# Patient Record
Sex: Female | Born: 1990 | Race: White | Hispanic: No | Smoking: Former smoker
Health system: Southern US, Community
[De-identification: ages and names within clinical notes are randomized; demographics above are authoritative.]

## PROBLEM LIST (undated history)

## (undated) DIAGNOSIS — E049 Nontoxic goiter, unspecified: Secondary | ICD-10-CM

## (undated) DIAGNOSIS — R011 Cardiac murmur, unspecified: Secondary | ICD-10-CM

## (undated) DIAGNOSIS — F419 Anxiety disorder, unspecified: Secondary | ICD-10-CM

## (undated) DIAGNOSIS — L0292 Furuncle, unspecified: Secondary | ICD-10-CM

## (undated) DIAGNOSIS — M419 Scoliosis, unspecified: Secondary | ICD-10-CM

## (undated) DIAGNOSIS — Z22322 Carrier or suspected carrier of Methicillin resistant Staphylococcus aureus: Secondary | ICD-10-CM

## (undated) DIAGNOSIS — F988 Other specified behavioral and emotional disorders with onset usually occurring in childhood and adolescence: Secondary | ICD-10-CM

## (undated) HISTORY — DX: Anxiety disorder, unspecified: F41.9

## (undated) HISTORY — PX: BREAST SURGERY: SHX581

## (undated) HISTORY — PX: TUBAL LIGATION: SHX77

## (undated) HISTORY — DX: Other specified behavioral and emotional disorders with onset usually occurring in childhood and adolescence: F98.8

## (undated) HISTORY — DX: Scoliosis, unspecified: M41.9

## (undated) HISTORY — PX: COSMETIC SURGERY: SHX468

---
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
## 2010-01-28 ENCOUNTER — Ambulatory Visit (HOSPITAL_COMMUNITY): Admission: RE | Admit: 2010-01-28 | Discharge: 2010-01-28 | Payer: Self-pay | Admitting: Obstetrics and Gynecology

## 2010-07-09 ENCOUNTER — Inpatient Hospital Stay (HOSPITAL_COMMUNITY): Admission: AD | Admit: 2010-07-09 | Discharge: 2010-07-12 | Payer: Self-pay | Admitting: Obstetrics and Gynecology

## 2010-10-05 ENCOUNTER — Emergency Department (HOSPITAL_COMMUNITY)
Admission: RE | Admit: 2010-10-05 | Discharge: 2010-10-05 | Payer: Self-pay | Source: Home / Self Care | Admitting: General Practice

## 2010-11-22 ENCOUNTER — Encounter: Payer: Self-pay | Admitting: Obstetrics and Gynecology

## 2011-01-12 LAB — URINALYSIS, ROUTINE W REFLEX MICROSCOPIC
Bilirubin Urine: NEGATIVE
Glucose, UA: NEGATIVE mg/dL
Hgb urine dipstick: NEGATIVE
Ketones, ur: 15 mg/dL — AB
Nitrite: NEGATIVE
Protein, ur: NEGATIVE mg/dL
Specific Gravity, Urine: 1.014 (ref 1.005–1.030)
Urobilinogen, UA: 0.2 mg/dL (ref 0.0–1.0)
pH: 5.5 (ref 5.0–8.0)

## 2011-01-12 LAB — CBC
MCH: 28.8 pg (ref 26.0–34.0)
Platelets: 278 10*3/uL (ref 150–400)
RBC: 4.41 MIL/uL (ref 3.87–5.11)
RDW: 13.1 % (ref 11.5–15.5)
WBC: 6.4 10*3/uL (ref 4.0–10.5)

## 2011-01-12 LAB — DIFFERENTIAL
Basophils Relative: 1 % (ref 0–1)
Eosinophils Relative: 2 % (ref 0–5)
Lymphs Abs: 2.4 10*3/uL (ref 0.7–4.0)
Neutrophils Relative %: 52 % (ref 43–77)

## 2011-01-14 LAB — CBC
HCT: 31 % — ABNORMAL LOW (ref 36.0–46.0)
Hemoglobin: 10.8 g/dL — ABNORMAL LOW (ref 12.0–15.0)
Hemoglobin: 12.2 g/dL (ref 12.0–15.0)
MCH: 33 pg (ref 26.0–34.0)
MCH: 33.5 pg (ref 26.0–34.0)
MCHC: 34.8 g/dL (ref 30.0–36.0)
MCV: 95.3 fL (ref 78.0–100.0)
RBC: 3.69 MIL/uL — ABNORMAL LOW (ref 3.87–5.11)

## 2011-03-16 NOTE — H&P (Signed)
Stacy Vaughan, Stacy Vaughan            ACCOUNT NO.:  [id number]   MEDICAL RECORD NO.:  [id number]          PATIENT TYPE:  INP   LOCATION:  0104                          FACILITY:  [facility]   PHYSICIAN:  Lalla Brothers, MDDATE OF BIRTH:  1991-04-24   DATE OF ADMISSION:  10/30/2007  DATE OF DISCHARGE:                       PSYCHIATRIC ADMISSION ASSESSMENT   IDENTIFICATION:  A 7-1/20-year-old female, eleventh grade student at  [name], is admitted emergently voluntarily on  referral from [facility] for inpatient stabilization and  treatment of suicide risk and depression.  The patient was scheduled for  her fourth psychotherapy session with Dr. Dorothyann Gibbs for 5:00 p.m. the  day of admission but had been unable to cognitively process and  effectively participate in therapy for beneficial change thus far.  In  fact, the patient has become more depressed such that for the 2 days  preceding admission, she had been threatening to shoot herself to commit  suicide.  She was attempting to elope from home and reported she could  not remember what was going on such that she could not contract for  safety.  The patient has no hope or interest in improvement but rather  predicts that things will only get worse and worse.  She is apathetic  and devaluing of the concerns and caring of others, such that she levels  her parents' and therapist's concerns, stating her old friends and those  concerned about her are babies, preferring to associate only with  boyfriend and his peer group who believe in crime and drugs but not  other aspects of life.   HISTORY OF PRESENT ILLNESS:  The patient has been accumulating mounting  consequences at home for her defiant and risk-taking behavior.  She is  now significantly confined relative to transportation and media, so that  she is just staying home.  However, the patient expects others to think  as she does relative to the lack of  meaning and purpose in life.  She  has had 3 F's on her interim report card when she had a 3.3 last grading  period and a 4.0 last year.  She takes AP and honors' courses.  She does  not take any medication for her ADHD with [facility] concluding ADHD to be present.  Pharmacotherapy was warranted and she was treated with Ritalin for 1  week prior to her testing and then Adderall for several months  afterward.  She and mother report that the Adderall caused weight loss,  dysphoric mood, and no other specific benefits.  Treatment overall  becomes devalued by the patient's process with family.  The patient is  known to have some pulmonic stenosis and mitral valve prolapse, assessed  and followed by Dr. Candis Musa and staff in the past as well as [facility], previously Dr. Brooke Dare and currently Dr. Maple Hudson.  The patient  does not acknowledge fear or have any activity limitations based in her  cardiac murmurs.  However, she is not attending to her daily health.  Her potassium is low on admission at 3.2 with lower limit  of normal 3.5,  but she will not acknowledge restricting, diuretics, or purging.  Patient is irritable and hypersensitive to the comments or actions of  others.  She is totally negative and has difficulty with impulse  control.  She is hopeless with loss of interest but does not have guilty  ruminations or self-blame.  Still, mother reports that the patient's  self-esteem has always been low whether associated with her ADHD or  social consequences.  The patient has had no known organic central  nervous system trauma unless associated with substance abuse.  She is  known to use alcohol and cannabis possibly every 2 weeks.  She will not  open up and discuss her pattern of use more thoroughly.  Parents note  that the patient arrived home late from being out with friends  approximately 0300 on October 29, 2007.  Parents note the  patient slept  for 9 hours and then was taken Urgent Care were urine drug screen was  performed and the results are pending, apparently due today.  Parents  are concerned that the patient's boyfriend and his peer group use drugs  continuously.  The patient reports that memory is limited and she has  relative thought blocking and impoverished emotions in thinking.  The  patient does not acknowledge hallucinations, but she has almost a  pseudodemented quality to her mood and cognition.   PAST MEDICAL HISTORY:  The patient is under the primary care currently  of Dr. Williemae Area.  She has a history of pulmonic stenosis and mitral  valve prolapse.  She has scars on the posterior thighs bilaterally and  the left anterior thigh.  She has contact lenses.  She denies sexual  activity.  She had chicken pox at age 50.  She has a potassium of 3.2 on  her initial laboratory findings but does not acknowledge mechanism for  depletion.  She had no medication allergies.  She has no history of  seizure or syncope.  She has had no arrhythmia.  She does not  acknowledge purposefully restricting in her diet or purging.  She denies  diuretics or other medications currently.   REVIEW OF SYSTEMS:  The patient denies difficulty with gait, gaze or  continence.  She denies exposure to communicable disease or toxins.  She  denies rash, jaundice or purpura.  She reports that memory is difficult  but has no headache.  She has no sensory loss or coordination deficit.  There is no cough, congestion, dyspnea, tachypnea or wheeze.  There is  no chest pain, palpitations or presyncope currently.  There is no  abdominal pain, nausea, vomiting or diarrhea acknowledged.  There is no  dysuria or arthralgia.   IMMUNIZATIONS:  Up-to-date.   FAMILY HISTORY:  The patient is an only child residing with both  parents.  She considers father to have been easier to talk to in the  past and mother more difficult.  However, she is now  having even more  difficulty with father in her opinion.  She informs parents that she  hates them and that things will never change for the better.  Paternal  aunt and maternal grandmother, as well as paternal great-grandmother,  have had depression.  There is a family history of hypertension and  diabetes mellitus on the extended maternal side.  She considers parents,  as well as past friends, to be babies and immature as they do not agree  with her opinions on validity of crime and drugs.  Maternal great-  grandfather died in 04-23-2007.   SOCIAL AND DEVELOPMENTAL HISTORY:  The patient is an eleventh grade  student at [name].  They report that her last  grading period secured a 3.3 GPA, having 2 AP courses and 2 honors'  courses that continue.  She now has 3 F's on the interim report.  She  had a four-point GPA last school year and parents report that she is  eligible for a 5.0 if she takes the 2 AP and 2 honors' classes.  The  patient denies sexual activity.  She acknowledges alcohol and cannabis  every 2 weeks.  The parents are concerned that there is much more  prevalent use, even to the point of impairing memory, concentration and  academic performance, as well as judgment and decisions.  The patient  denies legal charges.   ASSETS:  The patient is highly intelligent.   MENTAL STATUS EXAM:  VITAL SIGNS:  Height is 172 cm and weight is 64.5  kg.  Blood pressure is 135/72 with heart rate of 83 sitting and 123/76  with heart rate of 94 standing.  NEUROLOGIC:  She is right-handed.  She is alert and oriented though  significantly psychomotor slowed and cognitively distant.  Cranial  nerves II-XII are intact.  Muscle strength and tone are normal.  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  She may have some drug-  induced memory impairment.  There are no abnormal involuntary  movements.  Gait and gaze are intact.  MENTAL STATUS:  The patient inappropriately laughs when her low  potassium is explained and she will not clarify.  Otherwise, she is  severely dysphoric with repressed and suppressed anger.  She has  atypical depressive features with hysteroid dysphoria, hypersensitivity  to the comments or actions of others, impulse control difficulties and  easy outbursts of anger.  The patient does not present definite  hallucinations, paranoia or delusions.  She is cognitively impoverished,  whether depressive pseudodementia or marijuana memory impairment or  regressive oppositional defiance, or some combination of the 3.  She has  mild-to-moderate inattention, hyperactivity and impulsivity.  She has  suicidal ideation and planned to shoot herself to death, refusing to  contract for safety even when she could have done so in the service of  escaping of the devalued treatment.   IMPRESSION:  AXIS I:  1. Major depression, single episode, severe with atypical features.  2. Attention deficit hyperactivity disorder, combined subtype, mild-to-      moderate severity.  3. Oppositional defiant disorder (provisional diagnosis).  4. Rule out psychoactive substance abuse not otherwise specified      (provisional diagnosis).  5. Rule out eating disorder not otherwise specified (provisional      diagnosis).  6. Parent-child problem.  7. Other interpersonal problem.  8. Other specified family circumstances.  9. Noncompliance with treatment.  AXIS II:  Diagnosis deferred.  AXIS III:  1. Hypokalemia, etiology uncertain.  2. Mitral valve prolapse by history.  3. Pulmonic stenosis by history.  4. Weight loss on Adderall in the past.  5. Contact lenses.  AXIS IV:  Stressors family moderate acute and chronic; peer relations  severe acute and chronic; phase of life extreme acute and chronic;  school moderate acute and chronic.  AXIS V:  GAF on admission is 34 with highest  in last year 72.   PLAN:  The patient  is admitted for inpatient adolescent psychiatric and  multidisciplinary multimodal behavioral treatment in a team-based  programmatic locked psychiatric unit.  We will consider Wellbutrin  pharmacotherapy, and the family and patient are educated on such,  limiting the extensive discussion to that the patient's cognitive status  can tolerate.  Mother seems to expect the patient to refuse the  medication.  We will check EKG, particularly considering the low  potassium, the history of cardiac murmurs, particularly considering  possible institution of antidepressant pharmacotherapy.  We will recheck  basic metabolic panel and magnesium.  Cognitive behavioral therapy,  anger management, nutrition, substance abuse intervention, interpersonal  therapy, family therapy, social and communication skill training,  problem-solving and coping skill training, identity consolidation and  habit reversal can be undertaken.  Estimated length stay is 5-7 days  with target symptom for discharge being stabilization of suicide risk  and mood, stabilization of dangerous disruptive behavior and  generalization of the capacity for safe effective participation in  outpatient treatment.      Lalla Brothers, MD  Electronically Signed     GEJ/MEDQ  D:  10/31/2007  T:  10/31/2007  Job:  [id number]

## 2011-03-16 NOTE — Discharge Summary (Signed)
Stacy Vaughan, Stacy Vaughan            ACCOUNT NO.:  [id number]   MEDICAL RECORD NO.:  [id number]          PATIENT TYPE:  INP   LOCATION:  0104                          FACILITY:  [facility]   PHYSICIAN:  Elaina Pattee, MD       DATE OF BIRTH:  [birth year]   DATE OF ADMISSION:  10/30/2007  DATE OF DISCHARGE:  11/05/2007                               DISCHARGE SUMMARY   HISTORY OF PRESENT ILLNESS:  The patient is a 20 year old female who was  admitted to [facility] on referral from  [facility] for inpatient stabilization and treatment of  suicide risk and depression.  The patient had reportedly become  depressed and stated that she was going to shoot herself to commit  suicide.  She had been using marijuana and alcohol.  The patient has  also been sneaking out of the house at night and had been caught from  that.  She had been having her grades at school slip, and parent's were  concerned.  For full and complete history, please see admission  assessment dictated by Dr. Beverly Milch on October 30, 2007.   HOSPITAL COURSE:  The patient was admitted to the Child Unit.  She was  signed in voluntarily by her parents.  She was on no home medication at  the time of admission.  A substance abuse assessment was ordered and  completed.  The patient was initially started on Wellbutrin XL 150 mg  daily.  Initially, she refused this medication; however, after a few  days, she did become compliant.  Further lab work was ordered which  included positive marijuana screen.  A beta HCG was negative as was GC  and Chlamydia.  Urine drug screen was positive for marijuana.  TSH was  within normal limits at 2.21.  Free T4 was normal at 0.89.  RPR was  nonreactive.  GTT was 14.  Hepatic function panel was within normal  limits.  An initial BMP had a low sodium of 3.2.  CBC with differential  was within normal limits.  Because of the low potassium, an EKG was  ordered as was a  magnesium and repeat BMP.  The EKG was within normal  limits.  The repeat BMP showed a normal potassium of 3.9 and a magnesium  of 2.2.  Urine clean catch initially performed at [facility] had  greater than 100,000 colonies of lactobacillus.  Basic UA was yellow and  cloudy with moderate leukocytes and also many squamous epithelial cells  and many bacteria.  Repeat clean catch once again was positive for  lactobacillus.   While on the unit, the patient had multiple family sessions with Acquanetta Sit, M.S. to deal with family conflict.  Three sessions were held  which did resolve some of the conflict.  A nutrition consult was also  ordered which was completed and discussed at length with the patient.   The patient tolerated the dosage of Wellbutrin XL once she became  compliant with it.  It was slowly increased to a maximum dose of 300 mg  of the Wellbutrin  XL which she continued to tolerate.   On November 05, 2007, the treatment team met and felt that the patient was  appropriate for discharge.  She denied any suicidal or homicidal  thoughts, any auditory or visual hallucinations.  Insight and judgment  were both deemed to fair at that time.   DISCHARGE DIAGNOSIS:   AXIS I:  Major depressive disorder, single episode, severe.  History of  attention deficit hyperactivity disorder, oppositional defiant disorder,  marijuana abuse, and alcohol abuse.   AXIS II:  Deferred.   AXIS III:  Initial hypokalemia, now resolved.  Mitral valve prolapse by  history.  Pulmonary stenosis by history.   AXIS IV:  Family discord.   AXIS V:  GAF score on discharge is a 55.   FOLLOW UP:  Followup at this time is scheduled with Zara Chess with  [facility] on Friday, November 10, 2007.  Mother is advised  to contact case management tomorrow during working hours to arrange  psychiatric followup.   DISCHARGE MEDICATIONS:  Wellbutrin XL 300 mg daily.   MENTAL STATUS EXAM AT TIME OF  DISCHARGE:  The patient is calm and  cooperative.  She is alert and oriented x4.  Speech is regular rate and  rhythm and volume.  Mood is euthymic with somewhat reserved affect.  The  patient denies any current suicidal or homicidal ideation, any auditory  or visual hallucinations.  Insight and judgment are both deemed to be  fair.      Elaina Pattee, MD  Electronically Signed     MPM/MEDQ  D:  11/05/2007  T:  11/05/2007  Job:  [id number]

## 2011-08-06 LAB — DIFFERENTIAL
Basophils Absolute: 0
Basophils Relative: 0
Monocytes Relative: 5
Neutro Abs: 4.4
Neutrophils Relative %: 62

## 2011-08-06 LAB — BASIC METABOLIC PANEL
BUN: 8
CO2: 28
Calcium: 9
Calcium: 9.2
Creatinine, Ser: 0.85
Creatinine, Ser: 0.95
Sodium: 141

## 2011-08-06 LAB — HEPATIC FUNCTION PANEL
Alkaline Phosphatase: 65
Bilirubin, Direct: 0.1
Indirect Bilirubin: 0.5
Total Bilirubin: 0.6

## 2011-08-06 LAB — URINALYSIS, ROUTINE W REFLEX MICROSCOPIC
Bilirubin Urine: NEGATIVE
Glucose, UA: NEGATIVE
Ketones, ur: NEGATIVE
pH: 6.5

## 2011-08-06 LAB — T4, FREE: Free T4: 0.89

## 2011-08-06 LAB — DRUGS OF ABUSE SCREEN W/O ALC, ROUTINE URINE
Benzodiazepines.: NEGATIVE
Creatinine,U: 259.2
Marijuana Metabolite: POSITIVE — AB
Opiate Screen, Urine: NEGATIVE
Propoxyphene: NEGATIVE

## 2011-08-06 LAB — CBC
MCHC: 34.4
Platelets: 231
RBC: 4.31
RDW: 13.2

## 2011-08-06 LAB — GAMMA GT: GGT: 14

## 2011-08-06 LAB — URINE CULTURE

## 2011-08-06 LAB — URINE MICROSCOPIC-ADD ON

## 2011-08-06 LAB — RPR: RPR Ser Ql: NONREACTIVE

## 2011-10-15 IMAGING — US US OB DETAIL+14 WK
1 series · 14 of 28 positions shown · non-contrast
Comparison: none

OBSTETRICAL ULTRASOUND:
 This ultrasound was performed in The [HOSPITAL], and the [facility] report will be stored to [REDACTED] PACS.  This report is also available in [HOSPITAL]?s accessANYware.

[Series 1: us ob detail+14 wk · 14 of 115 slices shown]
[im 5/115]
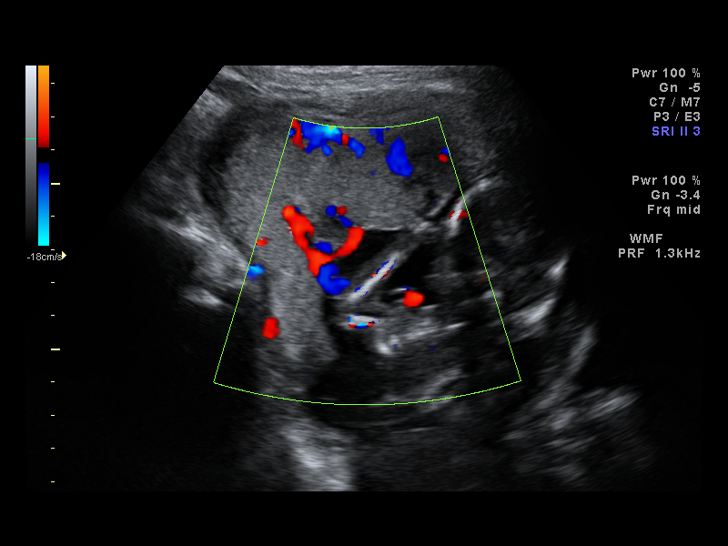
[im 13/115]
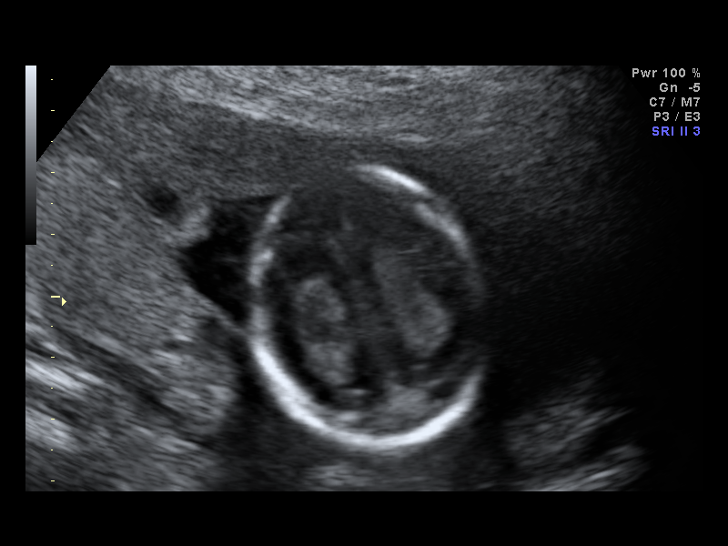
[im 22/115]
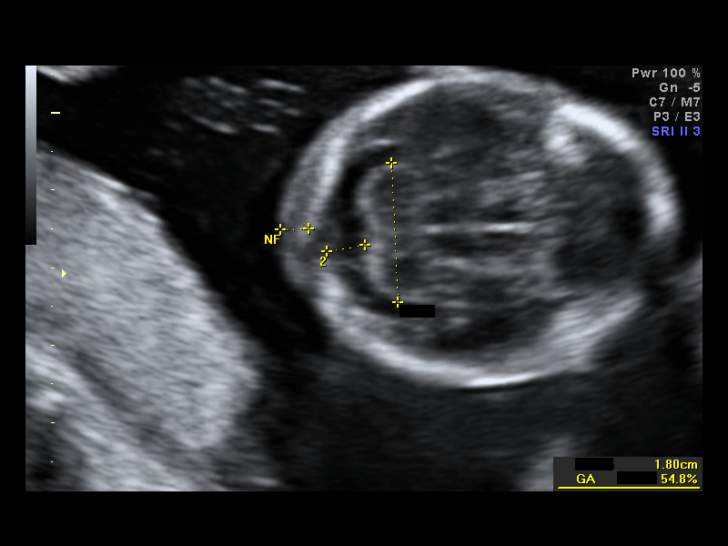
[im 30/115]
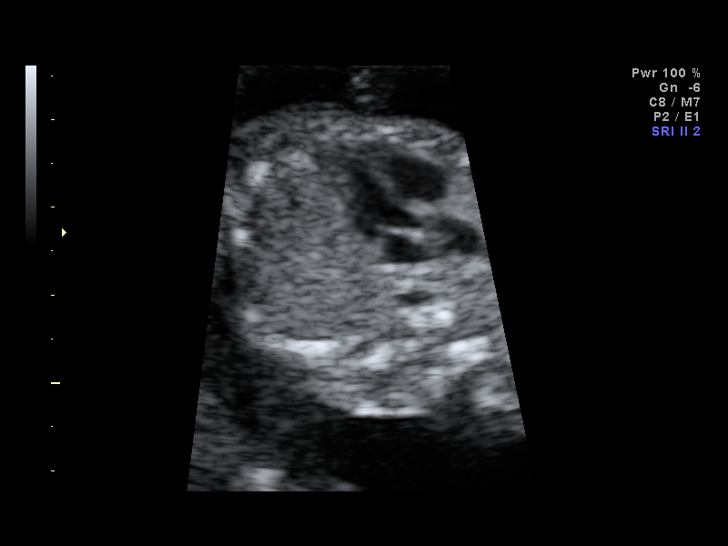
[im 39/115]
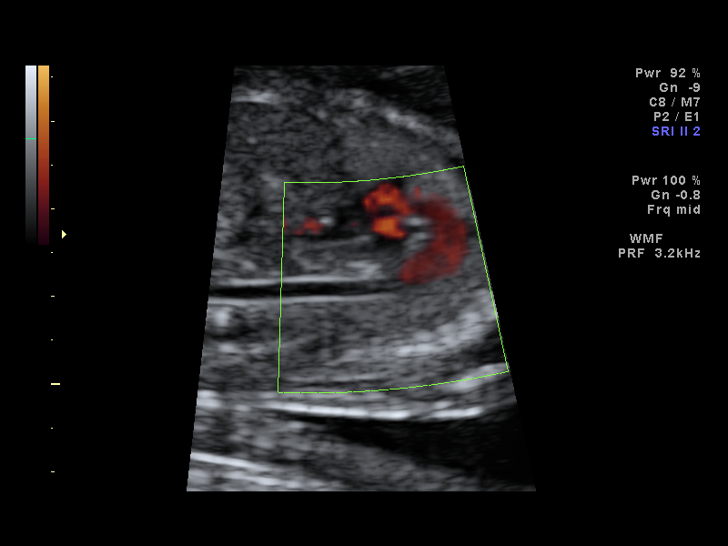
[im 47/115]
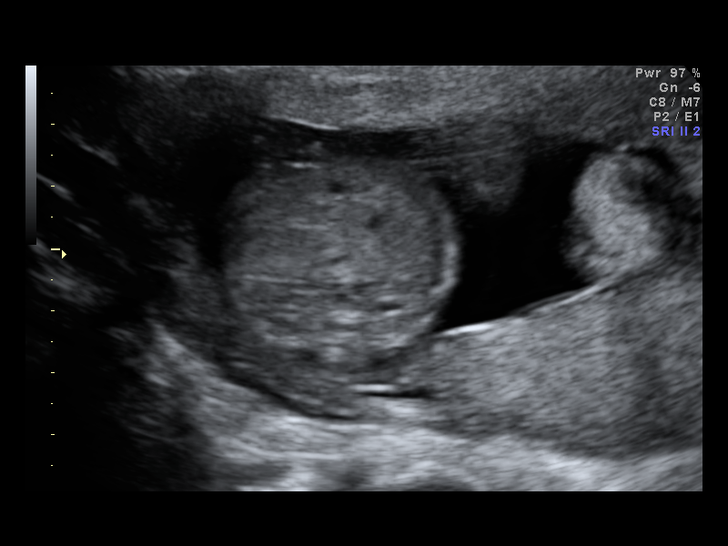
[im 55/115]
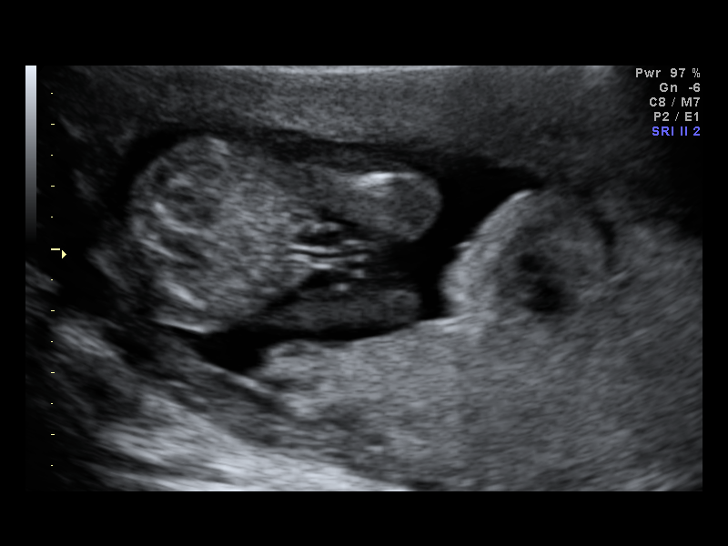
[im 64/115]
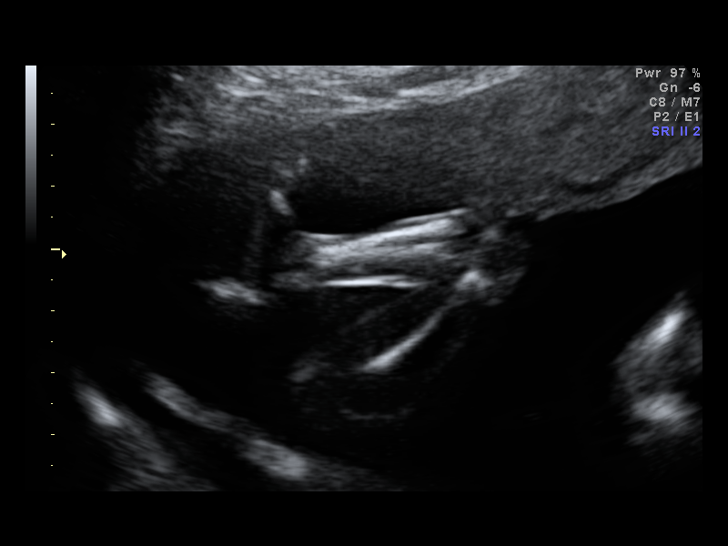
[im 72/115]
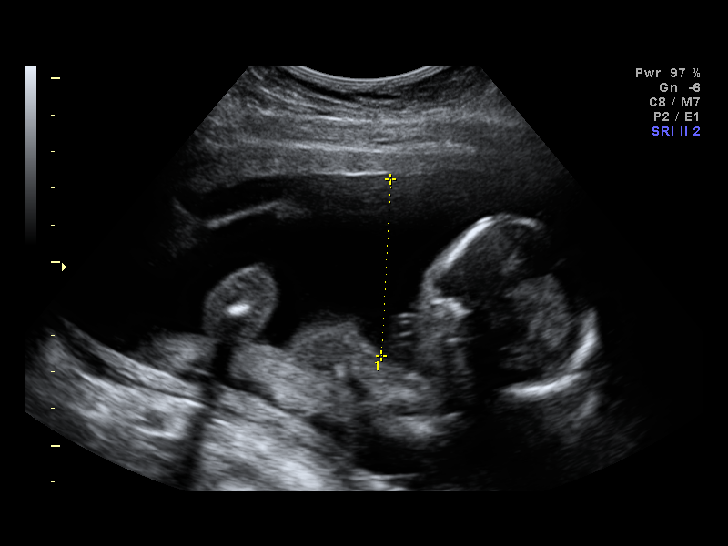
[im 81/115]
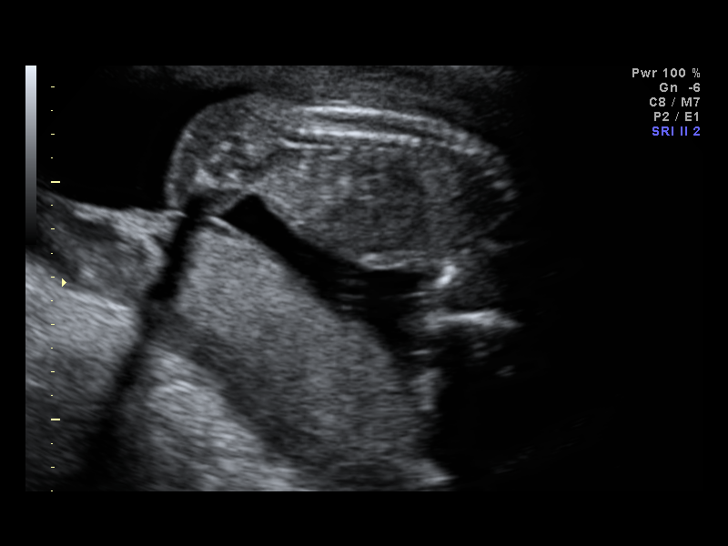
[im 89/115]
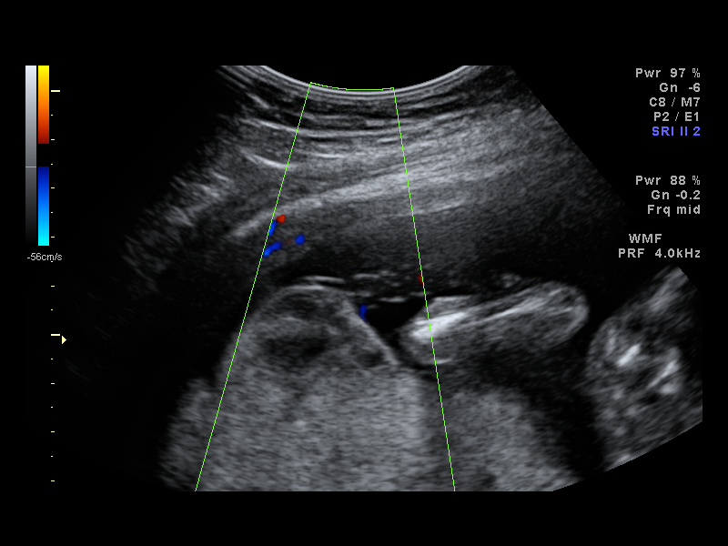
[im 98/115]
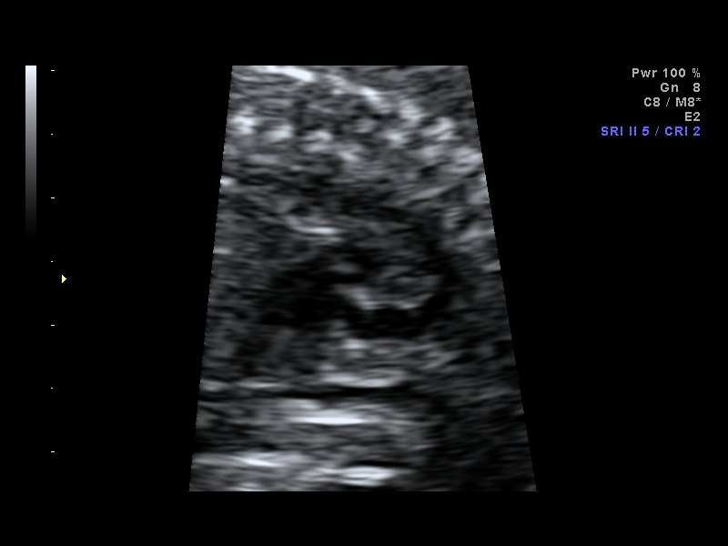
[im 106/115]
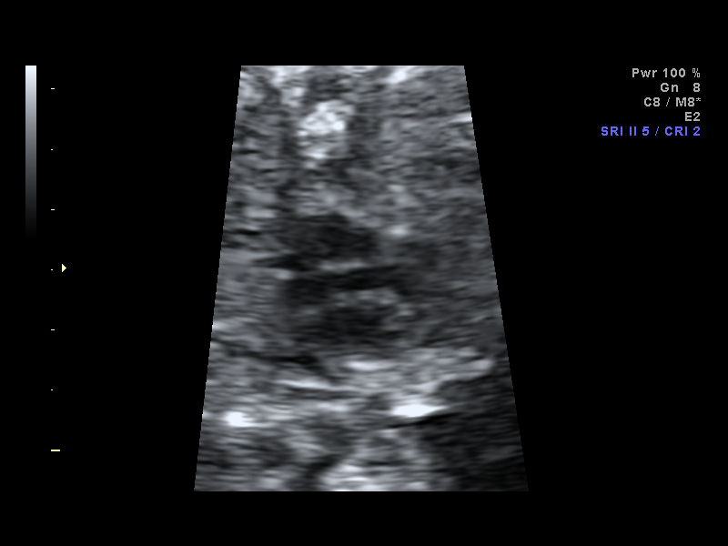
[im 115/115]
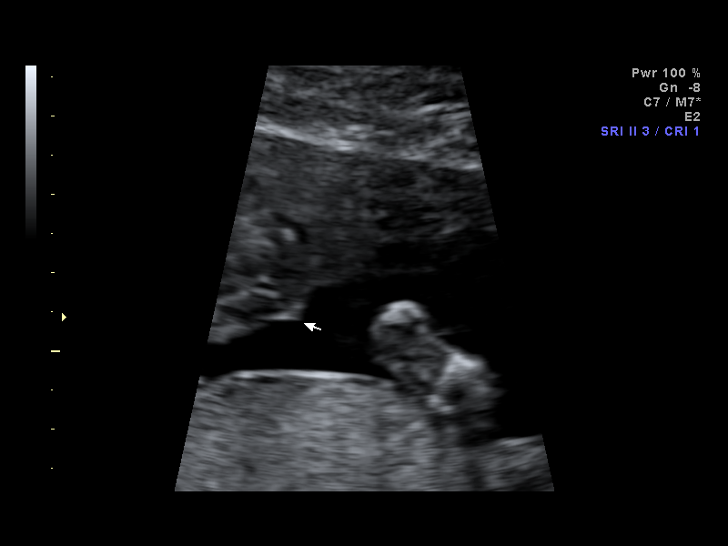

[14 of 28 positions shown; findings below may reference images not displayed]

IMPRESSION: [facility] has also been faxed to the ordering physician.

## 2012-06-21 IMAGING — US US ABDOMEN COMPLETE
1 series · 14 of 25 positions shown · non-contrast
Comparison: None

CLINICAL DATA: Elevated liver function tests.  Abdominal pain.

COMPLETE ABDOMINAL ULTRASOUND

[Series 1: us abdomen complete · 0.30mm/px · 14 of 77 slices shown]
[im 1/77]
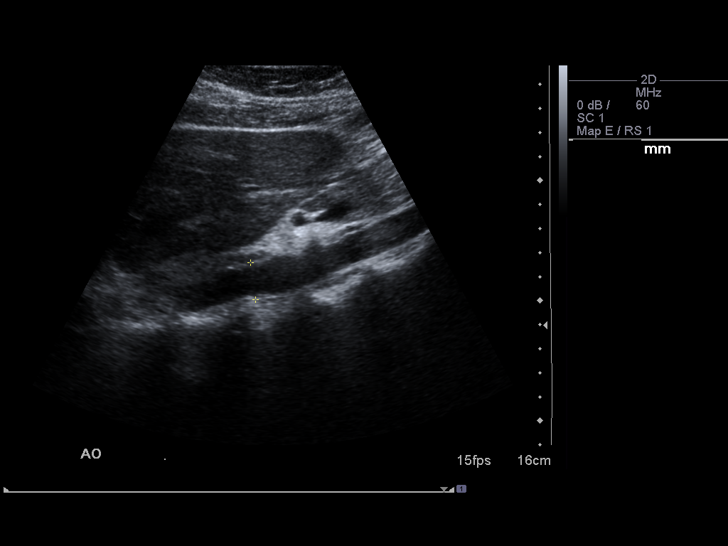
[im 7/77]
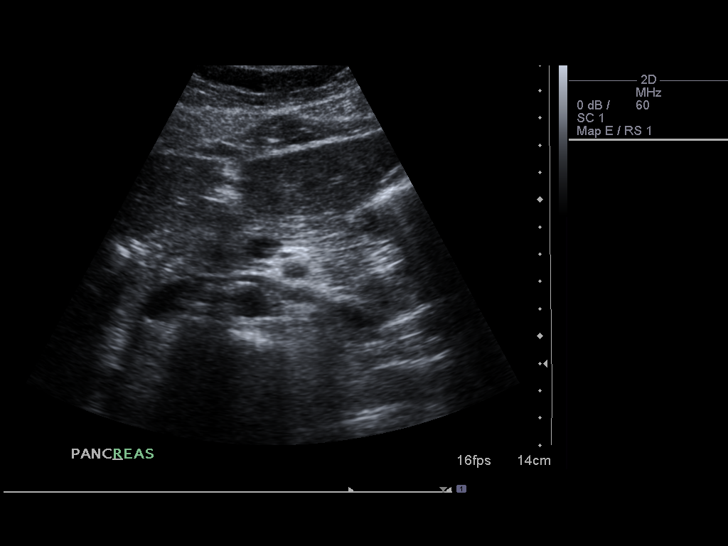
[im 13/77]
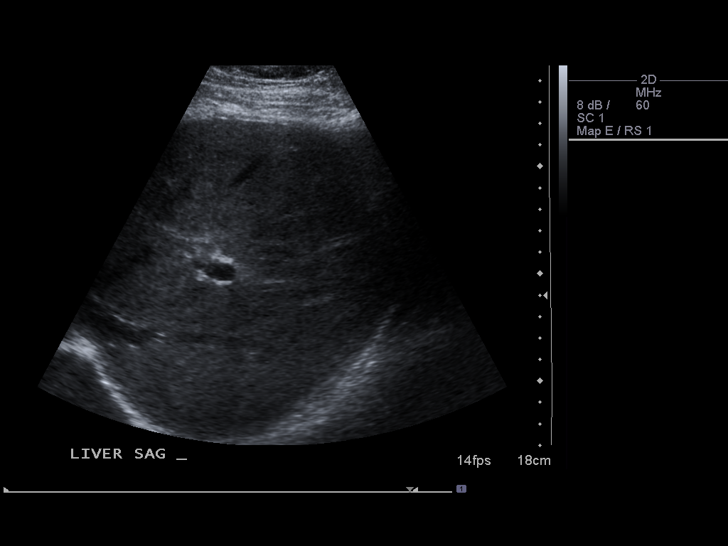
[im 20/77]
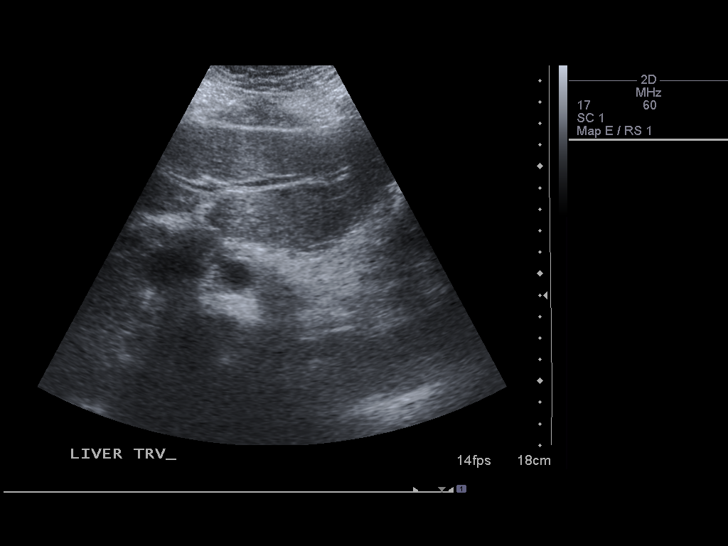
[im 26/77]
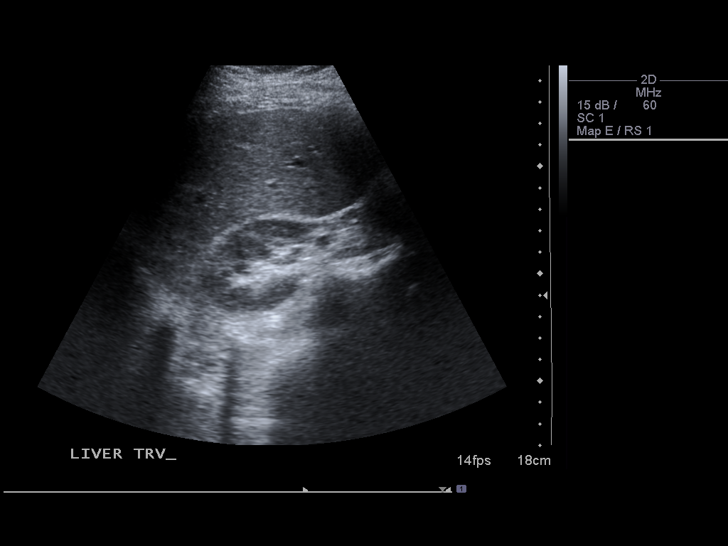
[im 29/77]
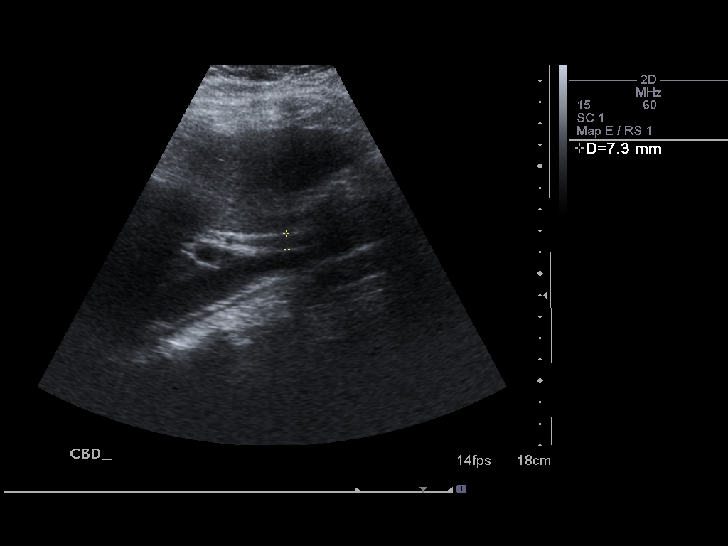
[im 35/77]
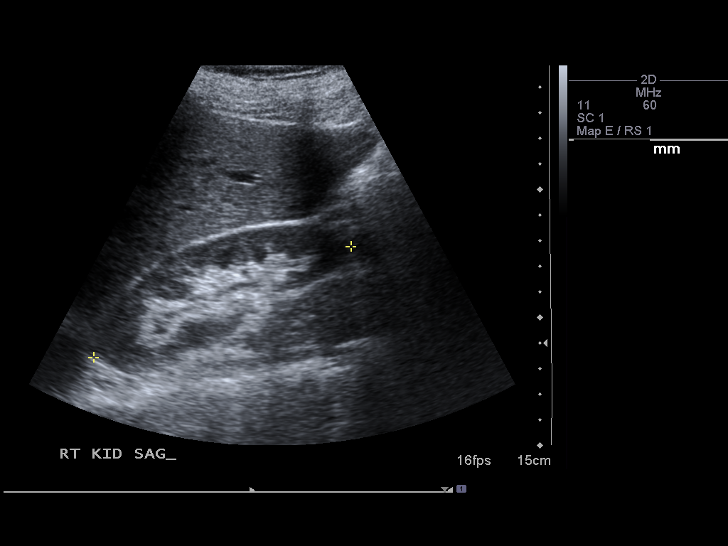
[im 42/77]
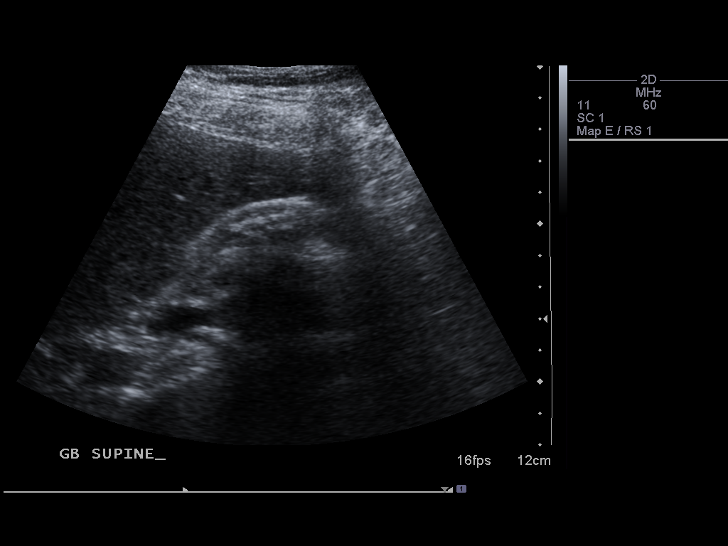
[im 48/77]
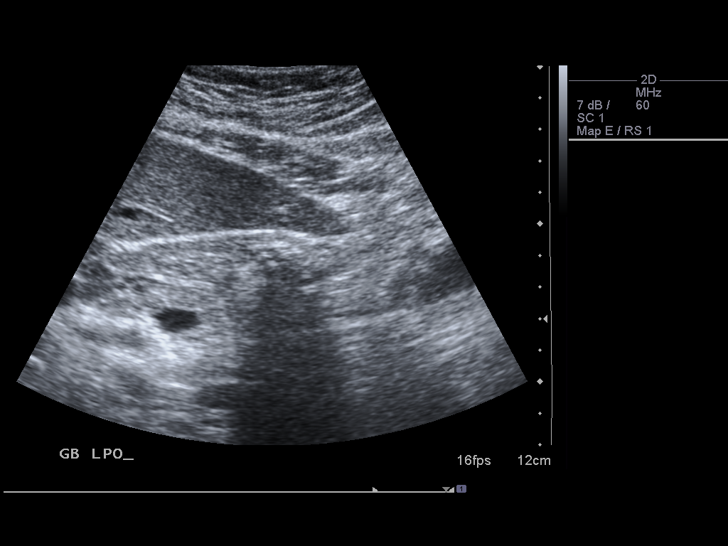
[im 51/77]
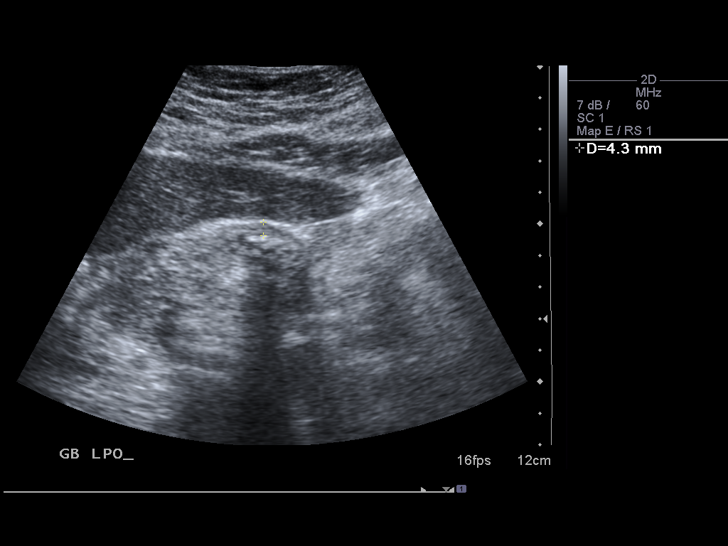
[im 58/77]
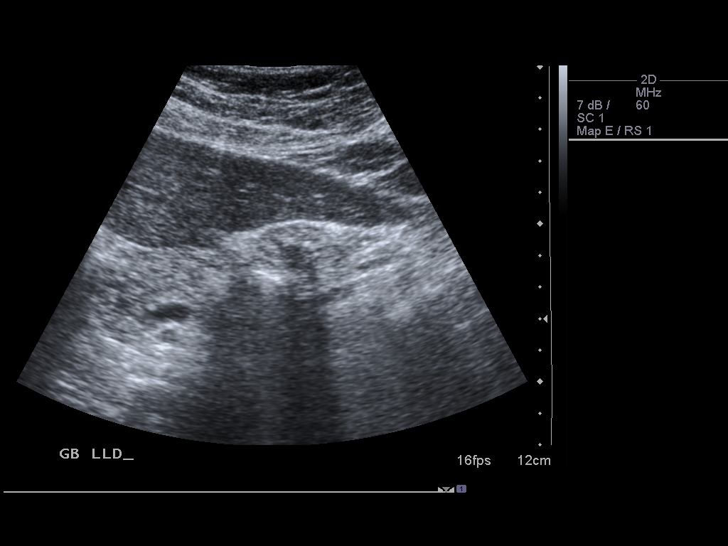
[im 64/77]
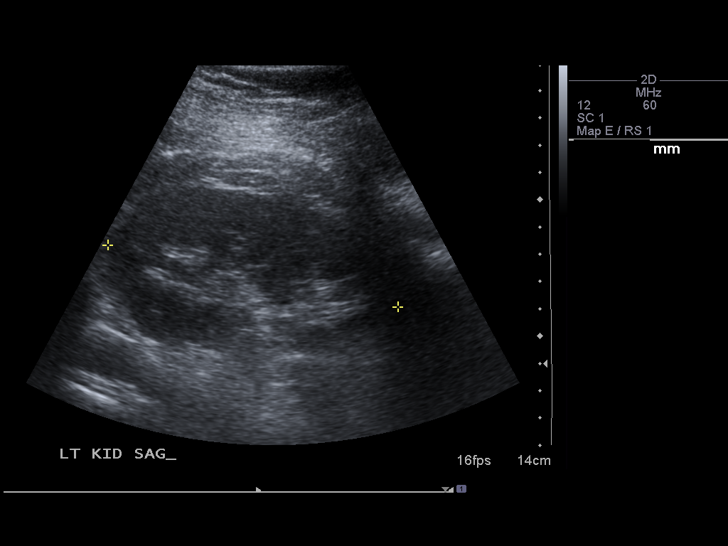
[im 70/77]
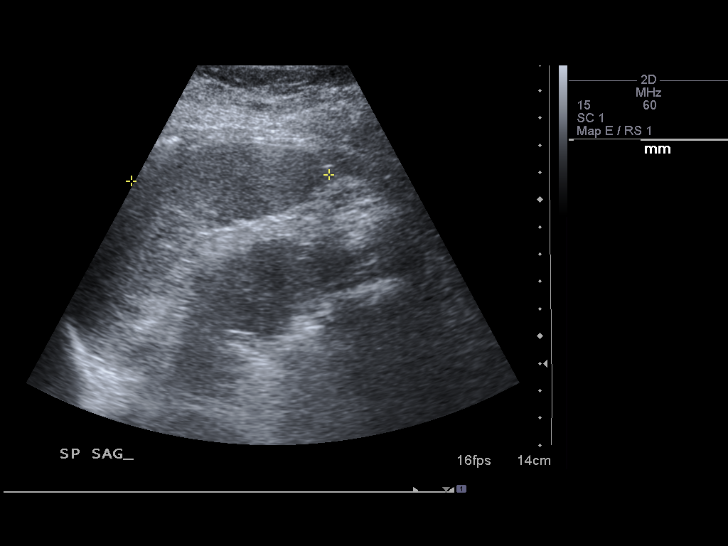
[im 77/77]
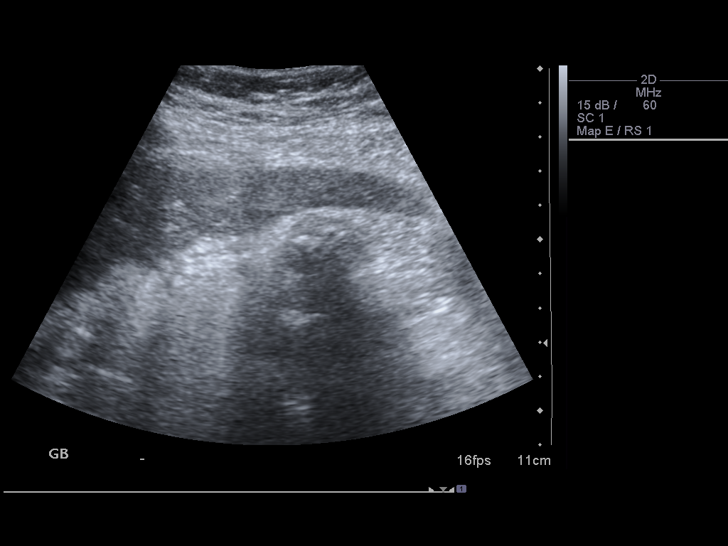

[14 of 25 positions shown; findings below may reference images not displayed]

FINDINGS: Gallbladder:  The gallbladder is contracted and full of stones.  No
definite Murphy's sign.  No pericholecystic fluid.

Common bile duct:  7.3 mm in diameter.  No ductal stone identified.
Small ductal stone remains a possibility given this diameter.

Liver:  Normal echogenicity.  No focal lesions.  No intrahepatic
dilatation.

IVC:  Normal

Pancreas:  Normal

Spleen:  Normal

Right Kidney:  Normal 11 cm.

Left Kidney:  Normal at 10.9 cm.  No cyst, mass, stone or
hydronephrosis.

Abdominal aorta:  Normal

No ascites
IMPRESSION: The gallbladder is full of gallstones and is contracted.  No
pericholecystic fluid.  No definite positive Murphy's sign.  The
common duct is prominent at 7 mm.  In a person of this age, this
does raise the question of a possible ductal stone, though this is
not specifically visualized.

## 2013-12-11 ENCOUNTER — Telehealth: Payer: Self-pay

## 2013-12-11 ENCOUNTER — Other Ambulatory Visit: Payer: Self-pay | Admitting: Internal Medicine

## 2013-12-11 DIAGNOSIS — F988 Other specified behavioral and emotional disorders with onset usually occurring in childhood and adolescence: Secondary | ICD-10-CM

## 2013-12-11 MED ORDER — LISDEXAMFETAMINE DIMESYLATE 50 MG PO CAPS: 50.0000 mg | ORAL_CAPSULE | Freq: Every day | ORAL | Status: DC

## 2013-12-11 NOTE — Telephone Encounter (Signed)
Pt's Rx ready to be picked up. Pt aware.

## 2014-03-11 DIAGNOSIS — F988 Other specified behavioral and emotional disorders with onset usually occurring in childhood and adolescence: Secondary | ICD-10-CM | POA: Insufficient documentation

## 2014-03-12 ENCOUNTER — Encounter: Payer: Self-pay | Admitting: Internal Medicine

## 2014-03-12 DIAGNOSIS — Z0000 Encounter for general adult medical examination without abnormal findings: Secondary | ICD-10-CM

## 2014-03-12 NOTE — Progress Notes (Signed)
Patient ID: Stacy Vaughan, female   DOB: [birth year], 23 y.o.   MRN: [id number]          Madlyn Frankel

## 2014-04-24 ENCOUNTER — Other Ambulatory Visit: Payer: Self-pay | Admitting: Internal Medicine

## 2014-05-30 ENCOUNTER — Ambulatory Visit: Payer: Managed Care, Other (non HMO) | Admitting: Internal Medicine

## 2014-05-30 ENCOUNTER — Encounter: Payer: Self-pay | Admitting: Internal Medicine

## 2014-05-30 VITALS — BP 110/68 | HR 64 | Temp 98.1°F | Resp 16 | Ht 68.0 in | Wt 140.8 lb

## 2014-05-30 DIAGNOSIS — E559 Vitamin D deficiency, unspecified: Secondary | ICD-10-CM

## 2014-05-30 DIAGNOSIS — R7402 Elevation of levels of lactic acid dehydrogenase (LDH): Secondary | ICD-10-CM

## 2014-05-30 DIAGNOSIS — Z113 Encounter for screening for infections with a predominantly sexual mode of transmission: Secondary | ICD-10-CM

## 2014-05-30 DIAGNOSIS — Z79899 Other long term (current) drug therapy: Secondary | ICD-10-CM

## 2014-05-30 DIAGNOSIS — F988 Other specified behavioral and emotional disorders with onset usually occurring in childhood and adolescence: Secondary | ICD-10-CM

## 2014-05-30 DIAGNOSIS — Z111 Encounter for screening for respiratory tuberculosis: Secondary | ICD-10-CM

## 2014-05-30 DIAGNOSIS — Z0000 Encounter for general adult medical examination without abnormal findings: Secondary | ICD-10-CM

## 2014-05-30 DIAGNOSIS — R740 Nonspecific elevation of levels of transaminase and lactic acid dehydrogenase [LDH]: Secondary | ICD-10-CM

## 2014-05-30 DIAGNOSIS — R7401 Elevation of levels of liver transaminase levels: Secondary | ICD-10-CM

## 2014-05-30 DIAGNOSIS — Z1212 Encounter for screening for malignant neoplasm of rectum: Secondary | ICD-10-CM

## 2014-05-30 LAB — CBC WITH DIFFERENTIAL/PLATELET
BASOS ABS: 0 10*3/uL (ref 0.0–0.1)
Basophils Relative: 0 % (ref 0–1)
Eosinophils Absolute: 0.1 10*3/uL (ref 0.0–0.7)
Eosinophils Relative: 2 % (ref 0–5)
HCT: 42.6 % (ref 36.0–46.0)
Hemoglobin: 14.1 g/dL (ref 12.0–15.0)
LYMPHS PCT: 45 % (ref 12–46)
Lymphs Abs: 2.4 10*3/uL (ref 0.7–4.0)
MCH: 29.9 pg (ref 26.0–34.0)
MCHC: 33.1 g/dL (ref 30.0–36.0)
MCV: 90.3 fL (ref 78.0–100.0)
Monocytes Absolute: 0.4 10*3/uL (ref 0.1–1.0)
Monocytes Relative: 7 % (ref 3–12)
NEUTROS PCT: 46 % (ref 43–77)
Neutro Abs: 2.5 10*3/uL (ref 1.7–7.7)
PLATELETS: 244 10*3/uL (ref 150–400)
RBC: 4.72 MIL/uL (ref 3.87–5.11)
RDW: 13.6 % (ref 11.5–15.5)
WBC: 5.4 10*3/uL (ref 4.0–10.5)

## 2014-05-30 LAB — HEMOGLOBIN A1C
HEMOGLOBIN A1C: 5.5 % (ref <5.7)
MEAN PLASMA GLUCOSE: 111 mg/dL (ref <117)

## 2014-05-30 MED ORDER — NORETHINDRONE 0.35 MG PO TABS: 1.0000 | ORAL_TABLET | Freq: Every day | ORAL | Status: DC

## 2014-05-30 MED ORDER — LISDEXAMFETAMINE DIMESYLATE 50 MG PO CAPS: 50.0000 mg | ORAL_CAPSULE | Freq: Every day | ORAL | Status: DC

## 2014-05-30 NOTE — Patient Instructions (Signed)
Health Maintenance - 17-23 Years Old SCHOOL PERFORMANCE After high school, you may attend college or technical or vocational school, enroll in the [employer], or enter the workforce. PHYSICAL, SOCIAL, AND EMOTIONAL DEVELOPMENT  One hour of regular physical activity daily is recommended. Continue to participate in sports.  Develop your own interests and consider community service or volunteerism.  Make decisions about college and work plans.  Throughout these years, you should assume responsibility for your own health care. Increasing independence is important for you.  You may be exploring your sexual identity. Understand that you should never be in a situation that makes you feel uncomfortable, and tell your partner if you do not want to engage in sexual activity.  Body image may become important to you. Be mindful that eating disorders can develop at this time. Talk to your parents or other caregivers if you have concerns about body image, weight gain, or losing weight.  You may notice mood disturbances, depression, anxiety, attention problems, or trouble with alcohol. Talk to your health care provider if you have concerns about mental illness.  Set limits for yourself and talk with your parents or other caregivers about independent decision making.  Handle conflict without physical violence.  Avoid loud noises which may impair hearing.  Limit television and computer time to 2 hours each day. Individuals who engage in excessive inactivity are more likely to become overweight. RECOMMENDED IMMUNIZATIONS  Influenza vaccine.  All adults should be immunized every year.  All adults, including pregnant women and people with hives-only allergy to eggs, can receive the inactivated influenza (IIV) vaccine.  Adults aged 18-49 years can receive the recombinant influenza (RIV) vaccine. The RIV vaccine does not contain any egg protein.  Tetanus, diphtheria, and acellular pertussis (Td, Tdap)  vaccine.  Pregnant women should receive 1 dose of Tdap vaccine during each pregnancy. The dose should be obtained regardless of the length of time since the last dose. Immunization is preferred during the 27th to 36th week of gestation.  An adult who has not previously received Tdap or who does not know his or her vaccine status should receive 1 dose of Tdap. This initial dose should be followed by tetanus and diphtheria toxoids (Td) booster doses every 10 years.  Adults with an unknown or incomplete history of completing a 3-dose immunization series with Td-containing vaccines should begin or complete a primary immunization series including a Tdap dose.  Adults should receive a Td booster every 10 years.  Varicella vaccine.  An adult without evidence of immunity to varicella should receive 2 doses or a second dose if he or she has previously received 1 dose.  Pregnant females who do not have evidence of immunity should receive the first dose after pregnancy. This first dose should be obtained before leaving the health care facility. The second dose should be obtained 4-8 weeks after the first dose.  Human papillomavirus (HPV) vaccine.  Females aged 13-26 years who have not received the vaccine previously should obtain the 3-dose series.  The vaccine is not recommended for pregnant females. However, pregnancy testing is not needed before receiving a dose. If a female is found to be pregnant after receiving a dose, no treatment is needed. In that case, the remaining doses should be delayed until after the pregnancy.  Males aged 67-21 years who have not received the vaccine previously should receive the 3-dose series. Males aged 22-26 years may be immunized.  Immunization is recommended through the age of 40 years for any  female who has sex with males and did not get any or all doses earlier.  Immunization is recommended for any person with an immunocompromised condition through the age of 69  years if he or she did not get any or all doses earlier.  During the 3-dose series, the second dose should be obtained 4-8 weeks after the first dose. The third dose should be obtained 24 weeks after the first dose and 16 weeks after the second dose.  Measles, mumps, and rubella (MMR) vaccine.  Adults born in [birth year] or later should have 1 or more doses of MMR vaccine unless there is a contraindication to the vaccine or there is laboratory evidence of immunity to each of the three diseases.  A routine second dose of MMR vaccine should be obtained at least 28 days after the first dose for students attending postsecondary schools, health care workers, and international travelers.  For females of childbearing age, rubella immunity should be determined. If there is no evidence of immunity, females who are not pregnant should be vaccinated. If there is no evidence of immunity, females who are pregnant should delay immunization until after pregnancy.  Pneumococcal 13-valent conjugate (PCV13) vaccine.  When indicated, a person who is uncertain of his or her immunization history and has no record of immunization should receive the PCV13 vaccine.  An adult aged 31 years or older who has certain medical conditions and has not been previously immunized should receive 1 dose of PCV13 vaccine. This PCV13 should be followed with a dose of pneumococcal polysaccharide (PPSV23) vaccine. The PPSV23 vaccine dose should be obtained at least 8 weeks after the dose of PCV13 vaccine.  An adult aged 88 years or older who has certain medical conditions and previously received 1 or more doses of PPSV23 vaccine should receive 1 dose of PCV13. The PCV13 vaccine dose should be obtained 1 or more years after the last PPSV23 vaccine dose.  Pneumococcal polysaccharide (PPSV23) vaccine.  When PCV13 is also indicated, PCV13 should be obtained first.  An adult younger than age 23 years who has certain medical conditions should be  immunized.  Any person who resides in a long-term care facility should be immunized.  An adult smoker should be immunized.  People with an immunocompromised condition and certain other conditions should receive both PCV13 and PPSV23 vaccines.  People with human immunodeficiency virus (HIV) infection should be immunized as soon as possible after diagnosis.  Immunization during chemotherapy or radiation therapy should be avoided.  Routine use of PPSV23 vaccine is not recommended for American Indians, [place] Natives, or people younger than 65 years unless there are medical conditions that require PPSV23 vaccine.  When indicated, people who have unknown immunization and have no record of immunization should receive PPSV23 vaccine.  One-time revaccination 5 years after the first dose of PPSV23 is recommended for people aged 19-64 years who have chronic kidney failure, nephrotic syndrome, asplenia, or immunocompromised conditions.  Meningococcal vaccine.  Adults with asplenia or persistent complement component deficiencies should receive 2 doses of quadrivalent meningococcal conjugate (MenACWY-D) vaccine. The doses should be obtained at least 2 months apart.  Microbiologists working with certain meningococcal bacteria, [place] recruits, people at risk during an outbreak, and people who travel to or live in countries with a high rate of meningitis should be immunized.  A first-year college student up through age 60 years who is living in a residence hall should receive a dose if he or she did not receive a dose on  or after his or her 16th birthday.  Adults who have certain high-risk conditions should receive one or more doses of vaccine.  Hepatitis A vaccine.  Adults who wish to be protected from this disease, have certain high-risk conditions, work with hepatitis A-infected animals, work in hepatitis A research labs, or travel to or work in countries with a high rate of hepatitis A should be  immunized.  Adults who were previously unvaccinated and who anticipate close contact with an international adoptee during the first 60 days after arrival in the [place] from a country with a high rate of hepatitis A should be immunized.  Hepatitis B vaccine.  Adults who wish to be protected from this disease, have certain high-risk conditions, may be exposed to blood or other infectious body fluids, are household contacts or sex partners of hepatitis B positive people, are clients or workers in certain care facilities, or travel to or work in countries with a high rate of hepatitis B should be immunized.  Haemophilus influenzae type b (Hib) vaccine.  A previously unvaccinated person with asplenia or sickle cell disease or having a scheduled splenectomy should receive 1 dose of Hib vaccine.  Regardless of previous immunization, a recipient of a hematopoietic stem cell transplant should receive a 3-dose series 6-12 months after his or her successful transplant.  Hib vaccine is not recommended for adults with HIV infection. TESTING  Annual screening for vision and hearing problems is recommended. Vision should be screened at least once between 18-21 years of age.  You may be screened for anemia or tuberculosis.  You should have a blood test to check for high cholesterol.  You should be screened for alcohol and drug use.  If you are sexually active, you may be screened for sexually transmitted infections (STIs), pregnancy, or HIV. You should be screened for STIs if:  Your sexual activity has changed since the last screening test, and you are at an increased risk for chlamydia or gonorrhea. Ask your health care provider if you are at risk.  If you are at an increased risk for hepatitis B, you should be screened for this virus. You are considered at high risk for hepatitis B if you:  Were born in a country where hepatitis B occurs often. Talk with your health care provider about which  countries are considered high risk.  Have parents who were born in a high-risk country and have not received a shot to protect against hepatitis B (hepatitis B vaccine).  Have HIV or AIDS.  Use needles to inject street drugs.  Live with or have sex with someone who has hepatitis B.  Are a man who has sex with other men (MSM).  Get hemodialysis treatment.  Take certain medicines for conditions like cancer, organ transplantation, or autoimmune conditions. NUTRITION   You should:  Have three servings of low-fat milk and dairy products daily. If you do not drink milk or consume dairy products, you should eat calcium-enriched foods, such as juice, bread, or cereal. Dark, leafy greens or canned fish are alternate sources of calcium.  Drink plenty of water. Fruit juice should be limited to 8-12 oz (240-360 mL) each day. Sugary beverages and sodas should be avoided.  Avoid eating foods high in fat, salt, or sugar, such as chips, candy, and cookies.  Avoid fast foods and limit eating out at restaurants.  Try not to skip meals, especially breakfast. You should eat a variety of vegetables, fruits, and lean meats.  Eat meals   together as a family whenever possible. ORAL HEALTH Brush your teeth twice a day and floss at least once a day. You should have two dental exams a year.  SKIN CARE You should wear sunscreen when out in the sun. TALK TO SOMEONE ABOUT:  Precautions against pregnancy, contraception, and sexually transmitted infections.  Taking a prescription medicine daily to prevent HIV infection if you are at risk of being infected with HIV. This is called preexposure prophylaxis (PrEP). You are at risk if you:  Are a female who has sex with other males (MSM).  Are heterosexual and sexually active with more than one partner.  Take drugs by injection.  Are sexually active with a partner who has HIV.  Whether you are at high risk of being infected with HIV. If you choose to begin  PrEP, you should first be tested for HIV. You should then be tested every 3 months for as long as you are taking PrEP.  Drug, tobacco, and alcohol use among your friends or at friends' homes. Smoking tobacco or marijuana and taking drugs have health consequences and may impact your brain development.  Appropriate use of over-the-counter or prescription medicines.  Driving guidelines and riding with friends.  The risks of drinking and driving or boating. Call someone if you have been drinking or using drugs and need a ride. WHAT'S NEXT? Visit your pediatrician or family physician once a year. By young adulthood, you should transition from your pediatrician to a family physician or internal medicine specialist. If you are a female and are sexually active, you may want to begin annual physical exams with a gynecologist. Document Released: 01/13/2007 Document Revised: 10/23/2013 Document Reviewed: 02/02/2007 [facility] Patient Information 2015 [place], [place]. This information is not intended to replace advice given to you by your health care provider. Make sure you discuss any questions you have with your health care provider.   Preventive Care for Adults A healthy lifestyle and preventive care can promote health and wellness. Preventive health guidelines for women include the following key practices.  A routine yearly physical is a good way to check with your health care provider about your health and preventive screening. It is a chance to share any concerns and updates on your health and to receive a thorough exam.  Visit your dentist for a routine exam and preventive care every 6 months. Brush your teeth twice a day and floss once a day. Good oral hygiene prevents tooth decay and gum disease.  The frequency of eye exams is based on your age, health, family medical history, use of contact lenses, and other factors. Follow your health care provider's recommendations for frequency of eye  exams.  Eat a healthy diet. Foods like vegetables, fruits, whole grains, low-fat dairy products, and lean protein foods contain the nutrients you need without too many calories. Decrease your intake of foods high in solid fats, added sugars, and salt. Eat the right amount of calories for you.Get information about a proper diet from your health care provider, if necessary.  Regular physical exercise is one of the most important things you can do for your health. Most adults should get at least 150 minutes of moderate-intensity exercise (any activity that increases your heart rate and causes you to sweat) each week. In addition, most adults need muscle-strengthening exercises on 2 or more days a week.  Maintain a healthy weight. The body mass index (BMI) is a screening tool to identify possible weight problems. It provides an estimate of  body fat based on height and weight. Your health care provider can find your BMI and can help you achieve or maintain a healthy weight.For adults 20 years and older:  A BMI below 18.5 is considered underweight.  A BMI of 18.5 to 24.9 is normal.  A BMI of 25 to 29.9 is considered overweight.  A BMI of 30 and above is considered obese.  Maintain normal blood lipids and cholesterol levels by exercising and minimizing your intake of saturated fat. Eat a balanced diet with plenty of fruit and vegetables. Blood tests for lipids and cholesterol should begin at age 25 and be repeated every 5 years. If your lipid or cholesterol levels are high, you are over 50, or you are at high risk for heart disease, you may need your cholesterol levels checked more frequently.Ongoing high lipid and cholesterol levels should be treated with medicines if diet and exercise are not working.  If you smoke, find out from your health care provider how to quit. If you do not use tobacco, do not start.  Lung cancer screening is recommended for adults aged 63-80 years who are at high risk for  developing lung cancer because of a history of smoking. A yearly low-dose CT scan of the lungs is recommended for people who have at least a 30-pack-year history of smoking and are a current smoker or have quit within the past 15 years. A pack year of smoking is smoking an average of 1 pack of cigarettes a day for 1 year (for example: 1 pack a day for 30 years or 2 packs a day for 15 years). Yearly screening should continue until the smoker has stopped smoking for at least 15 years. Yearly screening should be stopped for people who develop a health problem that would prevent them from having lung cancer treatment.  If you are pregnant, do not drink alcohol. If you are breastfeeding, be very cautious about drinking alcohol. If you are not pregnant and choose to drink alcohol, do not have more than 1 drink per day. One drink is considered to be 12 ounces (355 mL) of beer, 5 ounces (148 mL) of wine, or 1.5 ounces (44 mL) of liquor.  Avoid use of street drugs. Do not share needles with anyone. Ask for help if you need support or instructions about stopping the use of drugs.  High blood pressure causes heart disease and increases the risk of stroke. Your blood pressure should be checked at least every 1 to 2 years. Ongoing high blood pressure should be treated with medicines if weight loss and exercise do not work.  If you are 61-91 years old, ask your health care provider if you should take aspirin to prevent strokes.  Diabetes screening involves taking a blood sample to check your fasting blood sugar level. This should be done once every 3 years, after age 69, if you are within normal weight and without risk factors for diabetes. Testing should be considered at a younger age or be carried out more frequently if you are overweight and have at least 1 risk factor for diabetes.  Breast cancer screening is essential preventive care for women. You should practice "breast self-awareness." This means understanding  the normal appearance and feel of your breasts and may include breast self-examination. Any changes detected, no matter how small, should be reported to a health care provider. Women in their 52s and 30s should have a clinical breast exam (CBE) by a health care provider as part of  a regular health exam every 1 to 3 years. After age 7, women should have a CBE every year. Starting at age 50, women should consider having a mammogram (breast X-ray test) every year. Women who have a family history of breast cancer should talk to their health care provider about genetic screening. Women at a high risk of breast cancer should talk to their health care providers about having an MRI and a mammogram every year.  Breast cancer gene (BRCA)-related cancer risk assessment is recommended for women who have family members with BRCA-related cancers. BRCA-related cancers include breast, ovarian, tubal, and peritoneal cancers. Having family members with these cancers may be associated with an increased risk for harmful changes (mutations) in the breast cancer genes BRCA1 and BRCA2. Results of the assessment will determine the need for genetic counseling and BRCA1 and BRCA2 testing.  Routine pelvic exams to screen for cancer are no longer recommended for nonpregnant women who are considered low risk for cancer of the pelvic organs (ovaries, uterus, and vagina) and who do not have symptoms. Ask your health care provider if a screening pelvic exam is right for you.  If you have had past treatment for cervical cancer or a condition that could lead to cancer, you need Pap tests and screening for cancer for at least 20 years after your treatment. If Pap tests have been discontinued, your risk factors (such as having a new sexual partner) need to be reassessed to determine if screening should be resumed. Some women have medical problems that increase the chance of getting cervical cancer. In these cases, your health care provider may  recommend more frequent screening and Pap tests.  The HPV test is an additional test that may be used for cervical cancer screening. The HPV test looks for the virus that can cause the cell changes on the cervix. The cells collected during the Pap test can be tested for HPV. The HPV test could be used to screen women aged 62 years and older, and should be used in women of any age who have unclear Pap test results. After the age of 6, women should have HPV testing at the same frequency as a Pap test.  Colorectal cancer can be detected and often prevented. Most routine colorectal cancer screening begins at the age of 46 years and continues through age 105 years. However, your health care provider may recommend screening at an earlier age if you have risk factors for colon cancer. On a yearly basis, your health care provider may provide home test kits to check for hidden blood in the stool. Use of a small camera at the end of a tube, to directly examine the colon (sigmoidoscopy or colonoscopy), can detect the earliest forms of colorectal cancer. Talk to your health care provider about this at age 81, when routine screening begins. Direct exam of the colon should be repeated every 5-10 years through age 55 years, unless early forms of pre-cancerous polyps or small growths are found.  People who are at an increased risk for hepatitis B should be screened for this virus. You are considered at high risk for hepatitis B if:  You were born in a country where hepatitis B occurs often. Talk with your health care provider about which countries are considered high risk.  Your parents were born in a high-risk country and you have not received a shot to protect against hepatitis B (hepatitis B vaccine).  You have HIV or AIDS.  You use needles to  inject street drugs.  You live with, or have sex with, someone who has hepatitis B.  You get hemodialysis treatment.  You take certain medicines for conditions like  cancer, organ transplantation, and autoimmune conditions.  Hepatitis C blood testing is recommended for all people born from [birth year] through 1965 and any individual with known risks for hepatitis C.  Practice safe sex. Use condoms and avoid high-risk sexual practices to reduce the spread of sexually transmitted infections (STIs). STIs include gonorrhea, chlamydia, syphilis, trichomonas, herpes, HPV, and human immunodeficiency virus (HIV). Herpes, HIV, and HPV are viral illnesses that have no cure. They can result in disability, cancer, and death.  You should be screened for sexually transmitted illnesses (STIs) including gonorrhea and chlamydia if:  You are sexually active and are younger than 24 years.  You are older than 24 years and your health care provider tells you that you are at risk for this type of infection.  Your sexual activity has changed since you were last screened and you are at an increased risk for chlamydia or gonorrhea. Ask your health care provider if you are at risk.  If you are at risk of being infected with HIV, it is recommended that you take a prescription medicine daily to prevent HIV infection. This is called preexposure prophylaxis (PrEP). You are considered at risk if:  You are a heterosexual woman, are sexually active, and are at increased risk for HIV infection.  You take drugs by injection.  You are sexually active with a partner who has HIV.  Talk with your health care provider about whether you are at high risk of being infected with HIV. If you choose to begin PrEP, you should first be tested for HIV. You should then be tested every 3 months for as long as you are taking PrEP.  Osteoporosis is a disease in which the bones lose minerals and strength with aging. This can result in serious bone fractures or breaks. The risk of osteoporosis can be identified using a bone density scan. Women ages 41 years and over and women at risk for fractures or osteoporosis  should discuss screening with their health care providers. Ask your health care provider whether you should take a calcium supplement or vitamin D to reduce the rate of osteoporosis.  Menopause can be associated with physical symptoms and risks. Hormone replacement therapy is available to decrease symptoms and risks. You should talk to your health care provider about whether hormone replacement therapy is right for you.  Use sunscreen. Apply sunscreen liberally and repeatedly throughout the day. You should seek shade when your shadow is shorter than you. Protect yourself by wearing long sleeves, pants, a wide-brimmed hat, and sunglasses year round, whenever you are outdoors.  Once a month, do a whole body skin exam, using a mirror to look at the skin on your back. Tell your health care provider of new moles, moles that have irregular borders, moles that are larger than a pencil eraser, or moles that have changed in shape or color.  Stay current with required vaccines (immunizations).  Influenza vaccine. All adults should be immunized every year.  Tetanus, diphtheria, and acellular pertussis (Td, Tdap) vaccine. Pregnant women should receive 1 dose of Tdap vaccine during each pregnancy. The dose should be obtained regardless of the length of time since the last dose. Immunization is preferred during the 27th-36th week of gestation. An adult who has not previously received Tdap or who does not know her vaccine status should  receive 1 dose of Tdap. This initial dose should be followed by tetanus and diphtheria toxoids (Td) booster doses every 10 years. Adults with an unknown or incomplete history of completing a 3-dose immunization series with Td-containing vaccines should begin or complete a primary immunization series including a Tdap dose. Adults should receive a Td booster every 10 years.  Varicella vaccine. An adult without evidence of immunity to varicella should receive 2 doses or a second dose if  she has previously received 1 dose. Pregnant females who do not have evidence of immunity should receive the first dose after pregnancy. This first dose should be obtained before leaving the health care facility. The second dose should be obtained 4-8 weeks after the first dose.  Human papillomavirus (HPV) vaccine. Females aged 13-26 years who have not received the vaccine previously should obtain the 3-dose series. The vaccine is not recommended for use in pregnant females. However, pregnancy testing is not needed before receiving a dose. If a female is found to be pregnant after receiving a dose, no treatment is needed. In that case, the remaining doses should be delayed until after the pregnancy. Immunization is recommended for any person with an immunocompromised condition through the age of 93 years if she did not get any or all doses earlier. During the 3-dose series, the second dose should be obtained 4-8 weeks after the first dose. The third dose should be obtained 24 weeks after the first dose and 16 weeks after the second dose.  Zoster vaccine. One dose is recommended for adults aged 6 years or older unless certain conditions are present.  Measles, mumps, and rubella (MMR) vaccine. Adults born before [birth year] generally are considered immune to measles and mumps. Adults born in [birth year] or later should have 1 or more doses of MMR vaccine unless there is a contraindication to the vaccine or there is laboratory evidence of immunity to each of the three diseases. A routine second dose of MMR vaccine should be obtained at least 28 days after the first dose for students attending postsecondary schools, health care workers, or international travelers. People who received inactivated measles vaccine or an unknown type of measles vaccine during 1963-1967 should receive 2 doses of MMR vaccine. People who received inactivated mumps vaccine or an unknown type of mumps vaccine before 1979 and are at high risk for mumps  infection should consider immunization with 2 doses of MMR vaccine. For females of childbearing age, rubella immunity should be determined. If there is no evidence of immunity, females who are not pregnant should be vaccinated. If there is no evidence of immunity, females who are pregnant should delay immunization until after pregnancy. Unvaccinated health care workers born before [birth year] who lack laboratory evidence of measles, mumps, or rubella immunity or laboratory confirmation of disease should consider measles and mumps immunization with 2 doses of MMR vaccine or rubella immunization with 1 dose of MMR vaccine.  Pneumococcal 13-valent conjugate (PCV13) vaccine. When indicated, a person who is uncertain of her immunization history and has no record of immunization should receive the PCV13 vaccine. An adult aged 89 years or older who has certain medical conditions and has not been previously immunized should receive 1 dose of PCV13 vaccine. This PCV13 should be followed with a dose of pneumococcal polysaccharide (PPSV23) vaccine. The PPSV23 vaccine dose should be obtained at least 8 weeks after the dose of PCV13 vaccine. An adult aged 1 years or older who has certain medical conditions and previously received 1 or  more doses of PPSV23 vaccine should receive 1 dose of PCV13. The PCV13 vaccine dose should be obtained 1 or more years after the last PPSV23 vaccine dose.  Pneumococcal polysaccharide (PPSV23) vaccine. When PCV13 is also indicated, PCV13 should be obtained first. All adults aged 63 years and older should be immunized. An adult younger than age 56 years who has certain medical conditions should be immunized. Any person who resides in a nursing home or long-term care facility should be immunized. An adult smoker should be immunized. People with an immunocompromised condition and certain other conditions should receive both PCV13 and PPSV23 vaccines. People with human immunodeficiency virus (HIV)  infection should be immunized as soon as possible after diagnosis. Immunization during chemotherapy or radiation therapy should be avoided. Routine use of PPSV23 vaccine is not recommended for American Indians, [place] Natives, or people younger than 65 years unless there are medical conditions that require PPSV23 vaccine. When indicated, people who have unknown immunization and have no record of immunization should receive PPSV23 vaccine. One-time revaccination 5 years after the first dose of PPSV23 is recommended for people aged 19-64 years who have chronic kidney failure, nephrotic syndrome, asplenia, or immunocompromised conditions. People who received 1-2 doses of PPSV23 before age 31 years should receive another dose of PPSV23 vaccine at age 67 years or later if at least 5 years have passed since the previous dose. Doses of PPSV23 are not needed for people immunized with PPSV23 at or after age 28 years.  Meningococcal vaccine. Adults with asplenia or persistent complement component deficiencies should receive 2 doses of quadrivalent meningococcal conjugate (MenACWY-D) vaccine. The doses should be obtained at least 2 months apart. Microbiologists working with certain meningococcal bacteria, [employer] recruits, people at risk during an outbreak, and people who travel to or live in countries with a high rate of meningitis should be immunized. A first-year college student up through age 17 years who is living in a residence hall should receive a dose if she did not receive a dose on or after her 16th birthday. Adults who have certain high-risk conditions should receive one or more doses of vaccine.  Hepatitis A vaccine. Adults who wish to be protected from this disease, have certain high-risk conditions, work with hepatitis A-infected animals, work in hepatitis A research labs, or travel to or work in countries with a high rate of hepatitis A should be immunized. Adults who were previously unvaccinated and who  anticipate close contact with an international adoptee during the first 60 days after arrival in the [place] from a country with a high rate of hepatitis A should be immunized.  Hepatitis B vaccine. Adults who wish to be protected from this disease, have certain high-risk conditions, may be exposed to blood or other infectious body fluids, are household contacts or sex partners of hepatitis B positive people, are clients or workers in certain care facilities, or travel to or work in countries with a high rate of hepatitis B should be immunized.  Haemophilus influenzae type b (Hib) vaccine. A previously unvaccinated person with asplenia or sickle cell disease or having a scheduled splenectomy should receive 1 dose of Hib vaccine. Regardless of previous immunization, a recipient of a hematopoietic stem cell transplant should receive a 3-dose series 6-12 months after her successful transplant. Hib vaccine is not recommended for adults with HIV infection. Preventive Services / Frequency Ages 59 to 12 years  Blood pressure check.** / Every 1 to 2 years.  Lipid and cholesterol check.** /  Every 5 years beginning at age 42.  Clinical breast exam.** / Every 3 years for women in their 25s and 55s.  BRCA-related cancer risk assessment.** / For women who have family members with a BRCA-related cancer (breast, ovarian, tubal, or peritoneal cancers).  Pap test.** / Every 2 years from ages 87 through 44. Every 3 years starting at age 48 through age 13 or 25 with a history of 3 consecutive normal Pap tests.  HPV screening.** / Every 3 years from ages 49 through ages 83 to 41 with a history of 3 consecutive normal Pap tests.  Hepatitis C blood test.** / For any individual with known risks for hepatitis C.  Skin self-exam. / Monthly.  Influenza vaccine. / Every year.  Tetanus, diphtheria, and acellular pertussis (Tdap, Td) vaccine.** / Consult your health care provider. Pregnant women should receive 1  dose of Tdap vaccine during each pregnancy. 1 dose of Td every 10 years.  Varicella vaccine.** / Consult your health care provider. Pregnant females who do not have evidence of immunity should receive the first dose after pregnancy.  HPV vaccine. / 3 doses over 6 months, if 55 and younger. The vaccine is not recommended for use in pregnant females. However, pregnancy testing is not needed before receiving a dose.  Measles, mumps, rubella (MMR) vaccine.** / You need at least 1 dose of MMR if you were born in 1957 or later. You may also need a 2nd dose. For females of childbearing age, rubella immunity should be determined. If there is no evidence of immunity, females who are not pregnant should be vaccinated. If there is no evidence of immunity, females who are pregnant should delay immunization until after pregnancy.  Pneumococcal 13-valent conjugate (PCV13) vaccine.** / Consult your health care provider.  Pneumococcal polysaccharide (PPSV23) vaccine.** / 1 to 2 doses if you smoke cigarettes or if you have certain conditions.  Meningococcal vaccine.** / 1 dose if you are age 85 to 35 years and a Market researcher living in a residence hall, or have one of several medical conditions, you need to get vaccinated against meningococcal disease. You may also need additional booster doses.  Hepatitis A vaccine.** / Consult your health care provider.  Hepatitis B vaccine.** / Consult your health care provider.  Haemophilus influenzae type b (Hib) vaccine.** / Consult your health care provider.

## 2014-05-30 NOTE — Progress Notes (Signed)
Patient ID: Stacy Vaughan, female   DOB: 1991/06/20, 23 y.o.   MRN: [id number]  Annual Screening Comprehensive Examination   This very nice 23 y.o.female presents for complete physical.  Patient has no major health issues. Patient does have ADD and has been treated for many years. She continues to report benefit of treatment with focusing and staying on task.   Finally, patient has history of Vitamin D Deficiency and last vitamin D was 33 in the past.    Medication Sig  . VYVANSE 50 MG cap Take 1 capsule (50 mg total) by mouth daily. For ADD   No Known Allergies  Past Medical History  Diagnosis Date  . ADD (attention deficit disorder)   . Scoliosis    Past Surgical History  Procedure Laterality Date  . Cholecystectomy  2003  . Wisdom tooth extraction  2008   Family History  Problem Relation Age of Onset  . Migraines Mother   . Hyperlipidemia Father    History   Social History  . Marital Status: Single    Spouse Name: N/A    Number of Children: N/A  . Years of Education: N/A   Occupational History  . Hair stylist   Social History Main Topics  . Smoking status: Not on file  . Smokeless tobacco: Not on file  . Alcohol Use: Not on file  . Drug Use: Not on file  . Sexual Activity: Not on file    ROS Constitutional: Denies fever, chills, weight loss/gain, headaches, insomnia, fatigue, night sweats, and change in appetite. Eyes: Denies redness, blurred vision, diplopia, discharge, itchy, watery eyes.  ENT: Denies discharge, congestion, post nasal drip, epistaxis, sore throat, earache, hearing loss, dental pain, Tinnitus, Vertigo, Sinus pain, snoring.  Cardio: Denies chest pain, palpitations, irregular heartbeat, syncope, dyspnea, diaphoresis, orthopnea, PND, claudication, edema Respiratory: denies cough, dyspnea, DOE, pleurisy, hoarseness, laryngitis, wheezing.  Gastrointestinal: Denies dysphagia, heartburn, reflux, water brash, pain, cramps, nausea, vomiting,  bloating, diarrhea, constipation, hematemesis, melena, hematochezia, jaundice, hemorrhoids Genitourinary: Denies dysuria, frequency, urgency, nocturia, hesitancy, discharge, hematuria, flank pain Breast: Breast lumps, nipple discharge, bleeding.  Musculoskeletal: Denies arthralgia, myalgia, stiffness, Jt. Swelling, pain, limp, and strain/sprain. Skin: Denies puritis, rash, hives, warts, acne, eczema, changing in skin lesion Neuro: Weakness, tremor, incoordination, spasms, paresthesia, pain Psychiatric: Denies confusion, memory loss, sensory loss. Endocrine: Denies change in weight, skin, hair change, nocturia, and paresthesia, diabetic polys, visual blurring, hyper /hypo glycemic episodes.  Heme/Lymph: No excessive bleeding, bruising, enlarged lymph nodes.  Physical Exam  BP 110/68  Pulse 64  Temp(Src) 98.1 F (36.7 C) (Temporal)  Resp 16  Ht 5\' 8"  (1.727 m)  Wt 140 lb 12.8 oz (63.866 kg)  BMI 21.41 kg/m2  General Appearance: Well nourished and in no apparent distress. Eyes: PERRLA, EOMs, conjunctiva no swelling or erythema, normal fundi and vessels. Sinuses: No frontal/maxillary tenderness ENT/Mouth: EACs patent / TMs  nl. Nares clear without erythema, swelling, mucoid exudates. Oral hygiene is good. No erythema, swelling, or exudate. Tongue normal, non-obstructing. Tonsils not swollen or erythematous. Hearing normal.  Neck: Supple, thyroid normal. No bruits, nodes or JVD. Respiratory: Respiratory effort normal.  BS equal and clear bilateral without rales, rhonci, wheezing or stridor. Cardio: Heart sounds are normal with regular rate and rhythm and no murmurs, rubs or gallops. Peripheral pulses are normal and equal bilaterally without edema. No aortic or femoral bruits. Chest: symmetric with normal excursions and percussion. Abdomen: Flat, soft, with bowl sounds. Nontender, no guarding, rebound, hernias, masses, or organomegaly.  Paps per Dr Garwin Brothers. Lymphatics: Non tender without  lymphadenopathy.  Musculoskeletal: Full ROM all peripheral extremities, joint stability, 5/5 strength, and normal gait. Mild scoliosis. Skin: Warm and dry without rashes, lesions, cyanosis, clubbing or  ecchymosis. Multiple tattoos of trunk and extremities. Neuro: Cranial nerves intact, reflexes equal bilaterally. Normal muscle tone, no cerebellar symptoms. Sensation intact.  Pysch: Awake and oriented X 3, normal affect, Insight and Judgment appropriate.   Assessment and Plan  1. Annual Screening Examination 2. ADD 3. Scoliosis, mild  Continue prudent diet as discussed, weight control, regular exercise, and medications. Routine screening labs and tests as requested with regular follow-up as recommended.

## 2014-05-31 LAB — LIPID PANEL
Cholesterol: 165 mg/dL (ref 0–200)
HDL: 72 mg/dL (ref >39)
LDL Cholesterol: 72 mg/dL (ref 0–99)
TRIGLYCERIDES: 103 mg/dL (ref <150)
Total CHOL/HDL Ratio: 2.3 Ratio
VLDL: 21 mg/dL (ref 0–40)

## 2014-05-31 LAB — BASIC METABOLIC PANEL WITH GFR
BUN: 8 mg/dL (ref 6–23)
CHLORIDE: 106 meq/L (ref 96–112)
CO2: 22 mEq/L (ref 19–32)
Calcium: 9.3 mg/dL (ref 8.4–10.5)
Creat: 0.8 mg/dL (ref 0.50–1.10)
GFR, Est African American: >89 mL/min
Glucose, Bld: 92 mg/dL (ref 70–99)
POTASSIUM: 4.2 meq/L (ref 3.5–5.3)
SODIUM: 140 meq/L (ref 135–145)

## 2014-05-31 LAB — IRON AND TIBC
%SAT: 54 % (ref 20–55)
IRON: 183 ug/dL — AB (ref 42–145)
TIBC: 339 ug/dL (ref 250–470)
UIBC: 156 ug/dL (ref 125–400)

## 2014-05-31 LAB — URINALYSIS, MICROSCOPIC ONLY
Bacteria, UA: NONE SEEN
Casts: NONE SEEN
Crystals: NONE SEEN

## 2014-05-31 LAB — HEPATITIS B SURFACE ANTIBODY,QUALITATIVE: Hep B S Ab: NEGATIVE

## 2014-05-31 LAB — RPR

## 2014-05-31 LAB — MAGNESIUM: Magnesium: 2.1 mg/dL (ref 1.5–2.5)

## 2014-05-31 LAB — HEPATIC FUNCTION PANEL
ALK PHOS: 51 U/L (ref 39–117)
ALT: 12 U/L (ref 0–35)
AST: 19 U/L (ref 0–37)
Albumin: 4.4 g/dL (ref 3.5–5.2)
BILIRUBIN TOTAL: 0.7 mg/dL (ref 0.2–1.2)
Bilirubin, Direct: 0.1 mg/dL (ref 0.0–0.3)
Indirect Bilirubin: 0.6 mg/dL (ref 0.2–1.2)
Total Protein: 7.3 g/dL (ref 6.0–8.3)

## 2014-05-31 LAB — HEPATITIS B CORE ANTIBODY, TOTAL: HEP B C TOTAL AB: NONREACTIVE

## 2014-05-31 LAB — HEPATITIS A ANTIBODY, TOTAL: HEP A TOTAL AB: REACTIVE — AB

## 2014-05-31 LAB — HEPATITIS C ANTIBODY: HCV Ab: NEGATIVE

## 2014-05-31 LAB — HIV ANTIBODY (ROUTINE TESTING W REFLEX): HIV 1&2 Ab, 4th Generation: NONREACTIVE

## 2014-05-31 LAB — VITAMIN B12: VITAMIN B 12: 264 pg/mL (ref 211–911)

## 2014-05-31 LAB — TSH: TSH: 1.61 u[IU]/mL (ref 0.350–4.500)

## 2014-05-31 LAB — VITAMIN D 25 HYDROXY (VIT D DEFICIENCY, FRACTURES): Vit D, 25-Hydroxy: 41 ng/mL (ref 30–89)

## 2014-05-31 LAB — INSULIN, FASTING: Insulin fasting, serum: 10 u[IU]/mL (ref 3–28)

## 2014-06-03 LAB — TB SKIN TEST
Induration: 0 mm
TB SKIN TEST: NEGATIVE

## 2014-06-03 LAB — HEPATITIS B E ANTIBODY: Hepatitis Be Antibody: NONREACTIVE

## 2014-07-16 ENCOUNTER — Other Ambulatory Visit: Payer: Self-pay

## 2014-07-16 DIAGNOSIS — F988 Other specified behavioral and emotional disorders with onset usually occurring in childhood and adolescence: Secondary | ICD-10-CM

## 2014-07-16 MED ORDER — LISDEXAMFETAMINE DIMESYLATE 50 MG PO CAPS: 50.0000 mg | ORAL_CAPSULE | Freq: Every day | ORAL | Status: DC

## 2014-09-30 LAB — OB RESULTS CONSOLE HEPATITIS B SURFACE ANTIGEN: Hepatitis B Surface Ag: NEGATIVE

## 2014-09-30 LAB — OB RESULTS CONSOLE HIV ANTIBODY (ROUTINE TESTING): HIV: NONREACTIVE

## 2014-09-30 LAB — OB RESULTS CONSOLE RUBELLA ANTIBODY, IGM: Rubella: IMMUNE

## 2014-09-30 LAB — OB RESULTS CONSOLE RPR: RPR: NONREACTIVE

## 2014-11-01 NOTE — L&D Delivery Note (Signed)
Delivery Note At 9:49 PM a viable and healthy female was delivered via Vaginal, Spontaneous Delivery (Presentation: Middle Occiput Anterior).  APGAR: 7, 8; weight 8 lb 9.9 oz (3910 g).   Placenta status: Intact, Spontaneous not sent.  Cord: CAN x 1 reducible 3 vessels with the following complications: None.  Cord pH: n/a  Anesthesia: Epidural  Episiotomy: None Lacerations: Labial minora bilateral Suture Repair: 3.0 chromic Est. Blood Loss (mL): 250  Mom to postpartum.  Baby to Couplet care / Skin to Skin.  Conner Neiss A 05/07/2015, 12:41 AM

## 2014-11-07 ENCOUNTER — Encounter: Payer: Self-pay | Admitting: Internal Medicine

## 2014-11-07 ENCOUNTER — Ambulatory Visit (INDEPENDENT_AMBULATORY_CARE_PROVIDER_SITE_OTHER): Payer: Managed Care, Other (non HMO) | Admitting: Internal Medicine

## 2014-11-07 VITALS — BP 102/60 | HR 84 | Temp 99.0°F | Resp 16 | Ht 68.0 in | Wt 150.4 lb

## 2014-11-07 DIAGNOSIS — E559 Vitamin D deficiency, unspecified: Secondary | ICD-10-CM

## 2014-11-07 DIAGNOSIS — E538 Deficiency of other specified B group vitamins: Secondary | ICD-10-CM

## 2014-11-07 DIAGNOSIS — R509 Fever, unspecified: Secondary | ICD-10-CM

## 2014-11-07 LAB — VITAMIN B12: VITAMIN B 12: 248 pg/mL (ref 211–911)

## 2014-11-07 NOTE — Progress Notes (Signed)
   Subjective:    Patient ID: Stacy Vaughan, female    DOB: September 02, 1991, 24 y.o.   MRN: [id number]  HPI  24 yo WF presents 4 mo gravid with low grade fever 99.0  And neg resp/UT sx's. Relates she stopped her vyvanse prior to becoming pregnant. Has had a couple of OB visits and has taken preNatal mVit's sx's sporadically. Has hx/o very low Vit D an d very low Nl vit B12 levels.   Meds - none  No Known Allergies  Past Medical History  Diagnosis Date  . ADD (attention deficit disorder)   . Scoliosis    Review of Systems  Is negative otherwise.     Objective:   Physical Exam   BP 102/60 mmHg  Pulse 84  Temp(Src) 99 F (37.2 C)  Resp 16  Ht 5\' 8"  (1.727 m)  Wt 150 lb 6.4 oz (68.221 kg)  BMI 22.87 kg/m2  HEENT - Eac's patent. TM's Nl. EOM's full. PERRLA. NasoOroPharynx clear. Neck - supple. Nl Thyroid. Carotids 2+ & No bruits, nodes, JVD Chest - Clear equal BS w/o Rales, rhonchi, wheezes. Cor - Nl HS. RRR w/o sig MGR. PP 1(+). No edema. Abd - Soft / gravid w/o masses or tenderness. BS nl. MS- FROM w/o deformities. Muscle power, tone and bulk Nl. Gait Nl. Neuro - No obvious Cr N abnormalities. Sensory, motor and Cerebellar functions appear Nl w/o focal abnormalities. Psyche - Mental status normal & appropriate.  No delusions, ideations or obvious mood abnormalities.    Assessment & Plan:   1. Fever, unspecified fever cause  - Urine culture - Urine Microscopic  2. Vitamin D deficiency  - Vit D  25 hydroxy (rtn osteoporosis monitoring)  3. Vitamin B12 deficiency  - Vitamin B12  4. Folate deficiency  - Folate RBC

## 2014-11-08 LAB — URINALYSIS, MICROSCOPIC ONLY
CASTS: NONE SEEN
Crystals: NONE SEEN

## 2014-11-08 LAB — VITAMIN D 25 HYDROXY (VIT D DEFICIENCY, FRACTURES): Vit D, 25-Hydroxy: 26 ng/mL — ABNORMAL LOW (ref 30–100)

## 2014-11-08 LAB — FOLATE RBC: RBC FOLATE: 737 ng/mL (ref >280)

## 2014-11-09 LAB — URINE CULTURE
Colony Count: NO GROWTH
Organism ID, Bacteria: NO GROWTH

## 2015-01-09 DIAGNOSIS — M4120 Other idiopathic scoliosis, site unspecified: Secondary | ICD-10-CM | POA: Insufficient documentation

## 2015-03-13 ENCOUNTER — Encounter: Payer: Self-pay | Admitting: Internal Medicine

## 2015-04-01 LAB — OB RESULTS CONSOLE GBS: GBS: NEGATIVE

## 2015-04-01 LAB — OB RESULTS CONSOLE GC/CHLAMYDIA
Chlamydia: NEGATIVE
GC PROBE AMP, GENITAL: NEGATIVE

## 2015-05-06 ENCOUNTER — Inpatient Hospital Stay (HOSPITAL_COMMUNITY)
Admission: AD | Admit: 2015-05-06 | Discharge: 2015-05-06 | Disposition: A | Discharge status: Home / Self Care | Payer: Managed Care, Other (non HMO) | Source: Ambulatory Visit | Attending: Obstetrics and Gynecology | Admitting: Obstetrics and Gynecology

## 2015-05-06 ENCOUNTER — Encounter (HOSPITAL_COMMUNITY): Payer: Self-pay | Admitting: *Deleted

## 2015-05-06 ENCOUNTER — Inpatient Hospital Stay (HOSPITAL_COMMUNITY): Payer: Managed Care, Other (non HMO) | Admitting: Anesthesiology

## 2015-05-06 ENCOUNTER — Inpatient Hospital Stay (HOSPITAL_COMMUNITY)
Admission: AD | Admit: 2015-05-06 | Discharge: 2015-05-08 | DRG: 775 | Disposition: A | Discharge status: Home / Self Care | Payer: Managed Care, Other (non HMO) | Source: Ambulatory Visit | Attending: Obstetrics and Gynecology | Admitting: Obstetrics and Gynecology

## 2015-05-06 ENCOUNTER — Encounter (HOSPITAL_COMMUNITY): Payer: Self-pay

## 2015-05-06 DIAGNOSIS — O480 Post-term pregnancy: Principal | ICD-10-CM | POA: Diagnosis present

## 2015-05-06 DIAGNOSIS — K219 Gastro-esophageal reflux disease without esophagitis: Secondary | ICD-10-CM | POA: Diagnosis present

## 2015-05-06 DIAGNOSIS — M419 Scoliosis, unspecified: Secondary | ICD-10-CM | POA: Diagnosis present

## 2015-05-06 DIAGNOSIS — Z3A40 40 weeks gestation of pregnancy: Secondary | ICD-10-CM | POA: Diagnosis present

## 2015-05-06 DIAGNOSIS — O99214 Obesity complicating childbirth: Secondary | ICD-10-CM | POA: Diagnosis present

## 2015-05-06 DIAGNOSIS — E669 Obesity, unspecified: Secondary | ICD-10-CM | POA: Diagnosis present

## 2015-05-06 DIAGNOSIS — D62 Acute posthemorrhagic anemia: Secondary | ICD-10-CM | POA: Diagnosis not present

## 2015-05-06 DIAGNOSIS — O9962 Diseases of the digestive system complicating childbirth: Secondary | ICD-10-CM | POA: Diagnosis present

## 2015-05-06 DIAGNOSIS — Z87891 Personal history of nicotine dependence: Secondary | ICD-10-CM | POA: Diagnosis not present

## 2015-05-06 DIAGNOSIS — O9081 Anemia of the puerperium: Secondary | ICD-10-CM | POA: Diagnosis not present

## 2015-05-06 DIAGNOSIS — Z6831 Body mass index (BMI) 31.0-31.9, adult: Secondary | ICD-10-CM | POA: Diagnosis not present

## 2015-05-06 HISTORY — DX: Carrier or suspected carrier of methicillin resistant Staphylococcus aureus: Z22.322

## 2015-05-06 LAB — TYPE AND SCREEN
ABO/RH(D): O POS
Antibody Screen: NEGATIVE

## 2015-05-06 LAB — CBC
HEMATOCRIT: 34.4 % — AB (ref 36.0–46.0)
HEMOGLOBIN: 11.2 g/dL — AB (ref 12.0–15.0)
MCH: 27.5 pg (ref 26.0–34.0)
MCHC: 32.6 g/dL (ref 30.0–36.0)
MCV: 84.3 fL (ref 78.0–100.0)
Platelets: 193 10*3/uL (ref 150–400)
RBC: 4.08 MIL/uL (ref 3.87–5.11)
RDW: 13.9 % (ref 11.5–15.5)
WBC: 18.6 10*3/uL — AB (ref 4.0–10.5)

## 2015-05-06 MED ORDER — LIDOCAINE HCL (PF) 1 % IJ SOLN
INTRAMUSCULAR | Status: DC | PRN
  Administered 2015-05-06: 4 mL
  Administered 2015-05-06: 5 mL

## 2015-05-06 MED ORDER — FLEET ENEMA 7-19 GM/118ML RE ENEM: 1.0000 | ENEMA | RECTAL | Status: DC | PRN

## 2015-05-06 MED ORDER — DIPHENHYDRAMINE HCL 50 MG/ML IJ SOLN
12.5000 mg | INTRAMUSCULAR | Status: DC | PRN
Start: 2015-05-06 — End: 2015-05-06

## 2015-05-06 MED ORDER — ACETAMINOPHEN 325 MG PO TABS: 650.0000 mg | ORAL_TABLET | ORAL | Status: DC | PRN

## 2015-05-06 MED ORDER — PHENYLEPHRINE 40 MCG/ML (10ML) SYRINGE FOR IV PUSH (FOR BLOOD PRESSURE SUPPORT): 80.0000 ug | PREFILLED_SYRINGE | INTRAVENOUS | Status: DC | PRN

## 2015-05-06 MED ORDER — FENTANYL 2.5 MCG/ML BUPIVACAINE 1/10 % EPIDURAL INFUSION (WH - ANES)
14.0000 mL/h | INTRAMUSCULAR | Status: DC | PRN
  Filled 2015-05-06: qty 125

## 2015-05-06 MED ORDER — FENTANYL 2.5 MCG/ML BUPIVACAINE 1/10 % EPIDURAL INFUSION (WH - ANES)
14.0000 mL/h | INTRAMUSCULAR | Status: DC | PRN
  Administered 2015-05-06: 14 mL/h via EPIDURAL

## 2015-05-06 MED ORDER — OXYCODONE-ACETAMINOPHEN 5-325 MG PO TABS: 2.0000 | ORAL_TABLET | ORAL | Status: DC | PRN

## 2015-05-06 MED ORDER — LACTATED RINGERS IV SOLN: 500.0000 mL | INTRAVENOUS | Status: DC | PRN

## 2015-05-06 MED ORDER — DIPHENHYDRAMINE HCL 50 MG/ML IJ SOLN: 12.5000 mg | INTRAMUSCULAR | Status: DC | PRN

## 2015-05-06 MED ORDER — LIDOCAINE HCL (PF) 1 % IJ SOLN
30.0000 mL | INTRAMUSCULAR | Status: DC | PRN
  Administered 2015-05-06: 30 mL via SUBCUTANEOUS
  Filled 2015-05-06: qty 30

## 2015-05-06 MED ORDER — PHENYLEPHRINE 40 MCG/ML (10ML) SYRINGE FOR IV PUSH (FOR BLOOD PRESSURE SUPPORT)
80.0000 ug | PREFILLED_SYRINGE | INTRAVENOUS | Status: DC | PRN
  Filled 2015-05-06: qty 2
  Filled 2015-05-06: qty 20

## 2015-05-06 MED ORDER — CITRIC ACID-SODIUM CITRATE 334-500 MG/5ML PO SOLN: 30.0000 mL | ORAL | Status: DC | PRN

## 2015-05-06 MED ORDER — OXYTOCIN 40 UNITS IN LACTATED RINGERS INFUSION - SIMPLE MED
1.0000 m[IU]/min | INTRAVENOUS | Status: DC
  Administered 2015-05-06: 2 m[IU]/min via INTRAVENOUS

## 2015-05-06 MED ORDER — ONDANSETRON HCL 4 MG/2ML IJ SOLN: 4.0000 mg | Freq: Four times a day (QID) | INTRAMUSCULAR | Status: DC | PRN

## 2015-05-06 MED ORDER — EPHEDRINE 5 MG/ML INJ
10.0000 mg | INTRAVENOUS | Status: DC | PRN
  Filled 2015-05-06: qty 2

## 2015-05-06 MED ORDER — OXYCODONE-ACETAMINOPHEN 5-325 MG PO TABS: 1.0000 | ORAL_TABLET | ORAL | Status: DC | PRN

## 2015-05-06 MED ORDER — OXYTOCIN BOLUS FROM INFUSION: 500.0000 mL | INTRAVENOUS | Status: DC

## 2015-05-06 MED ORDER — TERBUTALINE SULFATE 1 MG/ML IJ SOLN
0.2500 mg | Freq: Once | INTRAMUSCULAR | Status: AC | PRN
  Filled 2015-05-06: qty 1

## 2015-05-06 MED ORDER — OXYTOCIN 40 UNITS IN LACTATED RINGERS INFUSION - SIMPLE MED
62.5000 mL/h | INTRAVENOUS | Status: DC
  Administered 2015-05-06: 62.5 mL/h via INTRAVENOUS
  Filled 2015-05-06: qty 1000

## 2015-05-06 MED ORDER — LACTATED RINGERS IV SOLN: INTRAVENOUS | Status: DC

## 2015-05-06 NOTE — Discharge Instructions (Signed)
Braxton Hicks Contractions °Contractions of the uterus can occur throughout pregnancy. Contractions are not always a sign that you are in labor.  °WHAT ARE BRAXTON HICKS CONTRACTIONS?  °Contractions that occur before labor are called Braxton Hicks contractions, or false labor. Toward the end of pregnancy (32-34 weeks), these contractions can develop more often and may become more forceful. This is not true labor because these contractions do not result in opening (dilatation) and thinning of the cervix. They are sometimes difficult to tell apart from true labor because these contractions can be forceful and people have different pain tolerances. You should not feel embarrassed if you go to the hospital with false labor. Sometimes, the only way to tell if you are in true labor is for your health care provider to look for changes in the cervix. °If there are no prenatal problems or other health problems associated with the pregnancy, it is completely safe to be sent home with false labor and await the onset of true labor. °HOW CAN YOU TELL THE DIFFERENCE BETWEEN TRUE AND FALSE LABOR? °False Labor °· The contractions of false labor are usually shorter and not as hard as those of true labor.   °· The contractions are usually irregular.   °· The contractions are often felt in the front of the lower abdomen and in the groin.   °· The contractions may go away when you walk around or change positions while lying down.   °· The contractions get weaker and are shorter lasting as time goes on.   °· The contractions do not usually become progressively stronger, regular, and closer together as with true labor.   °True Labor °1. Contractions in true labor last 30-70 seconds, become very regular, usually become more intense, and increase in frequency.   °2. The contractions do not go away with walking.   °3. The discomfort is usually felt in the top of the uterus and spreads to the lower abdomen and low back.   °4. True labor can  be determined by your health care provider with an exam. This will show that the cervix is dilating and getting thinner.   °WHAT TO REMEMBER °· Keep up with your usual exercises and follow other instructions given by your health care provider.   °· Take medicines as directed by your health care provider.   °· Keep your regular prenatal appointments.   °· Eat and drink lightly if you think you are going into labor.   °· If Braxton Hicks contractions are making you uncomfortable:   °· Change your position from lying down or resting to walking, or from walking to resting.   °· Sit and rest in a tub of warm water.   °· Drink 2-3 glasses of water. Dehydration may cause these contractions.   °· Do slow and deep breathing several times an hour.   °WHEN SHOULD I SEEK IMMEDIATE MEDICAL CARE? °Seek immediate medical care if: °· Your contractions become stronger, more regular, and closer together.   °· You have fluid leaking or gushing from your vagina.   °· You have a fever.   °· You pass blood-tinged mucus.   °· You have vaginal bleeding.   °· You have continuous abdominal pain.   °· You have low back pain that you never had before.   °· You feel your baby's head pushing down and causing pelvic pressure.   °· Your baby is not moving as much as it used to.   °Document Released: 10/18/2005 Document Revised: 10/23/2013 Document Reviewed: 07/30/2013 °[employer]® Patient Information ©2015 [employer]. This information is not intended to replace advice given to you by your health care   provider. Make sure you discuss any questions you have with your health care provider. ° °Fetal Movement Counts °Patient Name: __________________________________________________ Patient Due Date: ____________________ °Performing a fetal movement count is highly recommended in high-risk pregnancies, but it is good for every pregnant woman to do. Your health care provider may ask you to start counting fetal movements at 28 weeks of the pregnancy. Fetal  movements often increase: °· After eating a full meal. °· After physical activity. °· After eating or drinking something sweet or cold. °· At rest. °Pay attention to when you feel the baby is most active. This will help you notice a pattern of your baby's sleep and wake cycles and what factors contribute to an increase in fetal movement. It is important to perform a fetal movement count at the same time each day when your baby is normally most active.  °HOW TO COUNT FETAL MOVEMENTS °5. Find a quiet and comfortable area to sit or lie down on your left side. Lying on your left side provides the best blood and oxygen circulation to your baby. °6. Write down the day and time on a sheet of paper or in a journal. °7. Start counting kicks, flutters, swishes, rolls, or jabs in a 2-hour period. You should feel at least 10 movements within 2 hours. °8. If you do not feel 10 movements in 2 hours, wait 2-3 hours and count again. Look for a change in the pattern or not enough counts in 2 hours. °SEEK MEDICAL CARE IF: °· You feel less than 10 counts in 2 hours, tried twice. °· There is no movement in over an hour. °· The pattern is changing or taking longer each day to reach 10 counts in 2 hours. °· You feel the baby is not moving as he or she usually does. °Date: ____________ Movements: ____________ Start time: ____________ Finish time: ____________  °Date: ____________ Movements: ____________ Start time: ____________ Finish time: ____________ °Date: ____________ Movements: ____________ Start time: ____________ Finish time: ____________ °Date: ____________ Movements: ____________ Start time: ____________ Finish time: ____________ °Date: ____________ Movements: ____________ Start time: ____________ Finish time: ____________ °Date: ____________ Movements: ____________ Start time: ____________ Finish time: ____________ °Date: ____________ Movements: ____________ Start time: ____________ Finish time: ____________ °Date: ____________  Movements: ____________ Start time: ____________ Finish time: ____________  °Date: ____________ Movements: ____________ Start time: ____________ Finish time: ____________ °Date: ____________ Movements: ____________ Start time: ____________ Finish time: ____________ °Date: ____________ Movements: ____________ Start time: ____________ Finish time: ____________ °Date: ____________ Movements: ____________ Start time: ____________ Finish time: ____________ °Date: ____________ Movements: ____________ Start time: ____________ Finish time: ____________ °Date: ____________ Movements: ____________ Start time: ____________ Finish time: ____________ °Date: ____________ Movements: ____________ Start time: ____________ Finish time: ____________  °Date: ____________ Movements: ____________ Start time: ____________ Finish time: ____________ °Date: ____________ Movements: ____________ Start time: ____________ Finish time: ____________ °Date: ____________ Movements: ____________ Start time: ____________ Finish time: ____________ °Date: ____________ Movements: ____________ Start time: ____________ Finish time: ____________ °Date: ____________ Movements: ____________ Start time: ____________ Finish time: ____________ °Date: ____________ Movements: ____________ Start time: ____________ Finish time: ____________ °Date: ____________ Movements: ____________ Start time: ____________ Finish time: ____________  °Date: ____________ Movements: ____________ Start time: ____________ Finish time: ____________ °Date: ____________ Movements: ____________ Start time: ____________ Finish time: ____________ °Date: ____________ Movements: ____________ Start time: ____________ Finish time: ____________ °Date: ____________ Movements: ____________ Start time: ____________ Finish time: ____________ °Date: ____________ Movements: ____________ Start time: ____________ Finish time: ____________ °Date: ____________ Movements: ____________ Start time:  ____________ Finish time: ____________ °Date: ____________ Movements:   ____________ Start time: ____________ Finish time: ____________  °Date: ____________ Movements: ____________ Start time: ____________ Finish time: ____________ °Date: ____________ Movements: ____________ Start time: ____________ Finish time: ____________ °Date: ____________ Movements: ____________ Start time: ____________ Finish time: ____________ °Date: ____________ Movements: ____________ Start time: ____________ Finish time: ____________ °Date: ____________ Movements: ____________ Start time: ____________ Finish time: ____________ °Date: ____________ Movements: ____________ Start time: ____________ Finish time: ____________ °Date: ____________ Movements: ____________ Start time: ____________ Finish time: ____________  °Date: ____________ Movements: ____________ Start time: ____________ Finish time: ____________ °Date: ____________ Movements: ____________ Start time: ____________ Finish time: ____________ °Date: ____________ Movements: ____________ Start time: ____________ Finish time: ____________ °Date: ____________ Movements: ____________ Start time: ____________ Finish time: ____________ °Date: ____________ Movements: ____________ Start time: ____________ Finish time: ____________ °Date: ____________ Movements: ____________ Start time: ____________ Finish time: ____________ °Date: ____________ Movements: ____________ Start time: ____________ Finish time: ____________  °Date: ____________ Movements: ____________ Start time: ____________ Finish time: ____________ °Date: ____________ Movements: ____________ Start time: ____________ Finish time: ____________ °Date: ____________ Movements: ____________ Start time: ____________ Finish time: ____________ °Date: ____________ Movements: ____________ Start time: ____________ Finish time: ____________ °Date: ____________ Movements: ____________ Start time: ____________ Finish time: ____________ °Date:  ____________ Movements: ____________ Start time: ____________ Finish time: ____________ °Date: ____________ Movements: ____________ Start time: ____________ Finish time: ____________  °Date: ____________ Movements: ____________ Start time: ____________ Finish time: ____________ °Date: ____________ Movements: ____________ Start time: ____________ Finish time: ____________ °Date: ____________ Movements: ____________ Start time: ____________ Finish time: ____________ °Date: ____________ Movements: ____________ Start time: ____________ Finish time: ____________ °Date: ____________ Movements: ____________ Start time: ____________ Finish time: ____________ °Date: ____________ Movements: ____________ Start time: ____________ Finish time: ____________ °Document Released: 11/17/2006 Document Revised: 03/04/2014 Document Reviewed: 08/14/2012 °[employer]® Patient Information ©2015 [employer]. This information is not intended to replace advice given to you by your health care provider. Make sure you discuss any questions you have with your health care provider. ° °

## 2015-05-06 NOTE — Anesthesia Preprocedure Evaluation (Signed)
Anesthesia Evaluation  Patient identified by MRN, date of birth, ID band Patient awake    Reviewed: Allergy & Precautions, H&P , Patient's Chart, lab work & pertinent test results  Airway Mallampati: III  TM Distance: >3 FB Neck ROM: full    Dental no notable dental hx. (+) Teeth Intact   Pulmonary neg pulmonary ROS, former smoker,  breath sounds clear to auscultation  Pulmonary exam normal       Cardiovascular negative cardio ROS  Rhythm:regular Rate:Normal     Neuro/Psych PSYCHIATRIC DISORDERS ADDnegative neurological ROS     GI/Hepatic Neg liver ROS, GERD-  ,  Endo/Other  Obesity  Renal/GU negative Renal ROS  negative genitourinary   Musculoskeletal Scoliosis   Abdominal   Peds  Hematology   Anesthesia Other Findings   Reproductive/Obstetrics (+) Pregnancy                             Anesthesia Physical Anesthesia Plan  ASA: II  Anesthesia Plan: Epidural   Post-op Pain Management:    Induction:   Airway Management Planned: Natural Airway  Additional Equipment:   Intra-op Plan:   Post-operative Plan:   Informed Consent: I have reviewed the patients History and Physical, chart, labs and discussed the procedure including the risks, benefits and alternatives for the proposed anesthesia with the patient or authorized representative who has indicated his/her understanding and acceptance.     Plan Discussed with: Anesthesiologist  Anesthesia Plan Comments:         Anesthesia Quick Evaluation

## 2015-05-06 NOTE — Discharge Instructions (Signed)
Stacy Vaughan Patient Name: __________________________________________________ Patient Due Date: ____________________ Performing a fetal movement count is highly recommended in high-risk pregnancies, but it is good for every pregnant woman to do. Your health care provider may ask you to start counting fetal movements at 28 weeks of the pregnancy. Fetal movements often increase:  After eating a full meal.  After physical activity.  After eating or drinking something sweet or cold.  At rest. Pay attention to when you feel the baby is most active. This will help you notice a pattern of your baby's sleep and wake cycles and what factors contribute to an increase in fetal movement. It is important to perform a fetal movement count at the same time each day when your baby is normally most active.  HOW TO COUNT FETAL MOVEMENTS 1. Find a quiet and comfortable area to sit or lie down on your left side. Lying on your left side provides the best blood and oxygen circulation to your baby. 2. Write down the day and time on a sheet of paper or in a journal. 3. Start counting kicks, flutters, swishes, rolls, or jabs in a 2-hour period. You should feel at least 10 movements within 2 hours. 4. If you do not feel 10 movements in 2 hours, wait 2-3 hours and count again. Look for a change in the pattern or not enough Vaughan in 2 hours. SEEK MEDICAL CARE IF:  You feel less than 10 Vaughan in 2 hours, tried twice.  There is no movement in over an hour.  The pattern is changing or taking longer each day to reach 10 Vaughan in 2 hours.  You feel the baby is not moving as he or she usually does. Date: ____________ Movements: ____________ Start time: ____________ Stacy Vaughan time: ____________  Date: ____________ Movements: ____________ Start time: ____________ Stacy Vaughan time: ____________ Date: ____________ Movements: ____________ Start time: ____________ Stacy Vaughan time: ____________ Date: ____________ Movements:  ____________ Start time: ____________ Stacy Vaughan time: ____________ Date: ____________ Movements: ____________ Start time: ____________ Stacy Vaughan time: ____________ Date: ____________ Movements: ____________ Start time: ____________ Stacy Vaughan time: ____________ Date: ____________ Movements: ____________ Start time: ____________ Stacy Vaughan time: ____________ Date: ____________ Movements: ____________ Start time: ____________ Stacy Vaughan time: ____________  Date: ____________ Movements: ____________ Start time: ____________ Stacy Vaughan time: ____________ Date: ____________ Movements: ____________ Start time: ____________ Stacy Vaughan time: ____________ Date: ____________ Movements: ____________ Start time: ____________ Stacy Vaughan time: ____________ Date: ____________ Movements: ____________ Start time: ____________ Stacy Vaughan time: ____________ Date: ____________ Movements: ____________ Start time: ____________ Stacy Vaughan time: ____________ Date: ____________ Movements: ____________ Start time: ____________ Stacy Vaughan time: ____________ Date: ____________ Movements: ____________ Start time: ____________ Stacy Vaughan time: ____________  Date: ____________ Movements: ____________ Start time: ____________ Stacy Vaughan time: ____________ Date: ____________ Movements: ____________ Start time: ____________ Stacy Vaughan time: ____________ Date: ____________ Movements: ____________ Start time: ____________ Stacy Vaughan time: ____________ Date: ____________ Movements: ____________ Start time: ____________ Stacy Vaughan time: ____________ Date: ____________ Movements: ____________ Start time: ____________ Stacy Vaughan time: ____________ Date: ____________ Movements: ____________ Start time: ____________ Stacy Vaughan time: ____________ Date: ____________ Movements: ____________ Start time: ____________ Stacy Vaughan time: ____________  Date: ____________ Movements: ____________ Start time: ____________ Stacy Vaughan time: ____________ Date: ____________ Movements: ____________ Start time: ____________ Stacy Vaughan  time: ____________ Date: ____________ Movements: ____________ Start time: ____________ Stacy Vaughan time: ____________ Date: ____________ Movements: ____________ Start time: ____________ Stacy Vaughan time: ____________ Date: ____________ Movements: ____________ Start time: ____________ Stacy Vaughan time: ____________ Date: ____________ Movements: ____________ Start time: ____________ Stacy Vaughan time: ____________ Date: ____________ Movements: ____________ Start time: ____________ Stacy Vaughan time: ____________  Date: ____________ Movements: ____________ Start time: ____________ Stacy Vaughan  time: ____________ Date: ____________ Movements: ____________ Start time: ____________ Stacy Vaughan time: ____________ Date: ____________ Movements: ____________ Start time: ____________ Stacy Vaughan time: ____________ Date: ____________ Movements: ____________ Start time: ____________ Stacy Vaughan time: ____________ Date: ____________ Movements: ____________ Start time: ____________ Stacy Vaughan time: ____________ Date: ____________ Movements: ____________ Start time: ____________ Stacy Vaughan time: ____________ Date: ____________ Movements: ____________ Start time: ____________ Stacy Vaughan time: ____________  Date: ____________ Movements: ____________ Start time: ____________ Stacy Vaughan time: ____________ Date: ____________ Movements: ____________ Start time: ____________ Stacy Vaughan time: ____________ Date: ____________ Movements: ____________ Start time: ____________ Stacy Vaughan time: ____________ Date: ____________ Movements: ____________ Start time: ____________ Stacy Vaughan time: ____________ Date: ____________ Movements: ____________ Start time: ____________ Stacy Vaughan time: ____________ Date: ____________ Movements: ____________ Start time: ____________ Stacy Vaughan time: ____________ Date: ____________ Movements: ____________ Start time: ____________ Stacy Vaughan time: ____________  Date: ____________ Movements: ____________ Start time: ____________ Stacy Vaughan time: ____________ Date: ____________  Movements: ____________ Start time: ____________ Stacy Vaughan time: ____________ Date: ____________ Movements: ____________ Start time: ____________ Stacy Vaughan time: ____________ Date: ____________ Movements: ____________ Start time: ____________ Stacy Vaughan time: ____________ Date: ____________ Movements: ____________ Start time: ____________ Stacy Vaughan time: ____________ Date: ____________ Movements: ____________ Start time: ____________ Stacy Vaughan time: ____________ Date: ____________ Movements: ____________ Start time: ____________ Stacy Vaughan time: ____________  Date: ____________ Movements: ____________ Start time: ____________ Stacy Vaughan time: ____________ Date: ____________ Movements: ____________ Start time: ____________ Stacy Vaughan time: ____________ Date: ____________ Movements: ____________ Start time: ____________ Stacy Vaughan time: ____________ Date: ____________ Movements: ____________ Start time: ____________ Stacy Vaughan time: ____________ Date: ____________ Movements: ____________ Start time: ____________ Stacy Vaughan time: ____________ Date: ____________ Movements: ____________ Start time: ____________ Stacy Vaughan time: ____________ Document Released: 11/17/2006 Document Revised: 03/04/2014 Document Reviewed: 08/14/2012 [employer] Patient Information 2015 [place]. This information is not intended to replace advice given to you by your health care provider. Make sure you discuss any questions you have with your health care provider. Vaginal Delivery During delivery, your health care provider will help you give birth to your baby. During a vaginal delivery, you will work to push the baby out of your vagina. However, before you can push your baby out, a few things need to happen. The opening of your uterus (cervix) has to soften, thin out, and open up (dilate) all the way to 10 cm. Also, your baby has to move down from the uterus into your vagina.  SIGNS OF LABOR  Your health care provider will first need to make sure you are in labor.  Signs of labor include:   Passing what is called the mucous plug before labor begins. This is a small amount of blood-stained mucus.  Having regular, painful uterine contractions.   The time between contractions gets shorter.   The discomfort and pain gradually get more intense.  Contraction pains get worse when walking and do not go away when resting.   Your cervix becomes thinner (effacement) and dilates. BEFORE THE DELIVERY Once you are in labor and admitted into the hospital or care center, your health care provider may do the following:  5. Perform a complete physical exam. 6. Review any complications related to pregnancy or labor. 7. Check your blood pressure, pulse, temperature, and heart rate (vital signs).  8. Determine if, and when, the rupture of amniotic membranes occurred. 9. Do a vaginal exam (using a sterile glove and lubricant) to determine:  1. The position (presentation) of the baby. Is the baby's head presenting first (vertex) in the birth canal (vagina), or are the feet or buttocks first (breech)?  2. The level (station) of the baby's head within the birth canal.  3.  The effacement and dilatation of the cervix.  10. An electronic fetal monitor is usually placed on your abdomen when you first arrive. This is used to monitor your contractions and the baby's heart rate. 1. When the monitor is on your abdomen (external fetal monitor), it can only pick up the frequency and length of your contractions. It cannot tell the strength of your contractions. 2. If it becomes necessary for your health care provider to know exactly how strong your contractions are or to see exactly what the baby's heart rate is doing, an internal monitor may be inserted into your vagina and uterus. Your health care provider will discuss the benefits and risks of using an internal monitor and obtain your permission before inserting the device. 3. Continuous fetal monitoring may be needed if you  have an epidural, are receiving certain medicines (such as oxytocin), or have pregnancy or labor complications. 11. An IV access tube may be placed into a vein in your arm to deliver fluids and medicines if necessary. THREE STAGES OF LABOR AND DELIVERY Normal labor and delivery is divided into three stages. First Stage This stage starts when you begin to contract regularly and your cervix begins to efface and dilate. It ends when your cervix is completely open (fully dilated). The first stage is the longest stage of labor and can last from 3 hours to 15 hours.  Several methods are available to help with labor pain. You and your health care provider will decide which option is best for you. Options include:   Opioid medicines. These are strong pain medicines that you can get through your IV tube or as a shot into your muscle. These medicines lessen pain but do not make it go away completely.  Epidural. A medicine is given through a thin tube that is inserted in your back. The medicine numbs the lower part of your body and prevents any pain in that area.  Paracervical pain medicine. This is an injection of an anesthetic on each side of your cervix.   You may request natural childbirth, which does not involve the use of pain medicines or an epidural during labor and delivery. Instead, you will use other things, such as breathing exercises, to help cope with the pain. Second Stage The second stage of labor begins when your cervix is fully dilated at 10 cm. It continues until you push your baby down through the birth canal and the baby is born. This stage can take only minutes or several hours.  The location of your baby's head as it moves through the birth canal is reported as a number called a station. If the baby's head has not started its descent, the station is described as being at minus 3 (-3). When your baby's head is at the zero station, it is at the middle of the birth canal and is engaged  in the pelvis. The station of your baby helps indicate the progress of the second stage of labor.  When your baby is born, your health care provider may hold the baby with his or her head lowered to prevent amniotic fluid, mucus, and blood from getting into the baby's lungs. The baby's mouth and nose may be suctioned with a small bulb syringe to remove any additional fluid.  Your health care provider may then place the baby on your stomach. It is important to keep the baby from getting cold. To do this, the health care provider will dry the baby off, place the  baby directly on your skin (with no blankets between you and the baby), and cover the baby with warm, dry blankets.   The umbilical cord is cut. Third Stage During the third stage of labor, your health care provider will deliver the placenta (afterbirth) and make sure your bleeding is under control. The delivery of the placenta usually takes about 5 minutes but can take up to 30 minutes. After the placenta is delivered, a medicine may be given either by IV or injection to help contract the uterus and control bleeding. If you are planning to breastfeed, you can try to do so now. After you deliver the placenta, your uterus should contract and get very firm. If your uterus does not remain firm, your health care provider will massage it. This is important because the contraction of the uterus helps cut off bleeding at the site where the placenta was attached to your uterus. If your uterus does not contract properly and stay firm, you may continue to bleed heavily. If there is a lot of bleeding, medicines may be given to contract the uterus and stop the bleeding.  Document Released: 07/27/2008 Document Revised: 03/04/2014 Document Reviewed: 04/08/2013 [facility] Patient Information 2015 [place], [place]. This information is not intended to replace advice given to you by your health care provider. Make sure you discuss any questions you have with your  health care provider.

## 2015-05-06 NOTE — MAU Note (Signed)
Pt states second visit today. Contractions every 3 mins. Denies LOF or vag bleeding. +FM. -GBS

## 2015-05-06 NOTE — Anesthesia Procedure Notes (Signed)
Epidural Patient location during procedure: OB Start time: 05/06/2015 7:35 PM  Staffing Anesthesiologist: Josephine Igo Performed by: anesthesiologist   Preanesthetic Checklist Completed: patient identified, site marked, surgical consent, pre-op evaluation, timeout performed, IV checked, risks and benefits discussed and monitors and equipment checked  Epidural Patient position: sitting Prep: site prepped and draped and DuraPrep Patient monitoring: continuous pulse ox and blood pressure Approach: midline Location: L3-L4 Injection technique: LOR air  Needle:  Needle type: Tuohy  Needle gauge: 17 G Needle length: 9 cm and 9 Needle insertion depth: 6 cm Catheter type: closed end flexible Catheter size: 19 Gauge Catheter at skin depth: 11 cm Test dose: negative and Other  Assessment Events: blood not aspirated, injection not painful, no injection resistance, negative IV test and no paresthesia  Additional Notes Patient identified. Risks and benefits discussed including failed block, incomplete  Pain control, post dural puncture headache, nerve damage, paralysis, blood pressure Changes, nausea, vomiting, reactions to medications-both toxic and allergic and post Partum back pain. All questions were answered. Patient expressed understanding and wished to proceed. Sterile technique was used throughout procedure. Epidural site was Dressed with sterile barrier dressing. No paresthesias, signs of intravascular injection Or signs of intrathecal spread were encountered.  Patient was more comfortable after the epidural was dosed. Please see RN's note for documentation of vital signs and FHR which are stable.

## 2015-05-06 NOTE — MAU Note (Signed)
Uc's started during the night, have become more consistent, less than 10 minutes apart.  Denies bleeding or LOF.

## 2015-05-06 NOTE — H&P (Signed)
Stacy Vaughan is a 24 y.o. female presenting in active labor @ 40 3/7 weeks. Intact membrane Maternal Medical History:  Reason for admission: Contractions.   Contractions: Frequency: regular.   Perceived severity is strong.    Fetal activity: Perceived fetal activity is normal.    Prenatal complications: no prenatal complications Prenatal Complications - Diabetes: none.    OB History    Gravida Para Term Preterm AB TAB SAB Ectopic Multiple Living   2 1 1       1      Past Medical History  Diagnosis Date  . ADD (attention deficit disorder)   . Scoliosis   . MRSA (methicillin resistant staph aureus) culture positive 4 YEARS AGO   Past Surgical History  Procedure Laterality Date  . Cholecystectomy  2003  . Wisdom tooth extraction  2008   Family History: family history includes Hyperlipidemia in her father; Migraines in her mother. Social History:  reports that she has quit smoking. She does not have any smokeless tobacco history on file. She reports that she does not drink alcohol. Her drug history is not on file.   Prenatal Transfer Tool  Maternal Diabetes: No Genetic Screening: Declined Maternal Ultrasounds/Referrals: Normal Fetal Ultrasounds or other Referrals:  Fetal echo nl Maternal Substance Abuse:  No Significant Maternal Medications:  None Significant Maternal Lab Results:  Lab values include: Group B Strep negative Other Comments:  hx ADD stopped vyvanze with (+) UPT, mat hx pulm stenosis repair  Review of Systems  All other systems reviewed and are negative.   Dilation: 5 Effacement (%): 90 Station: -1 Exam by:: Dr. Garwin Vaughan  Asynclitic LOP Blood pressure 124/72, pulse 82, temperature 97.8 F (36.6 C), temperature source Oral, resp. rate 20, SpO2 98 %. Exam Physical Exam  Constitutional: She is oriented to person, place, and time. She appears well-developed and well-nourished.  HENT:  Head: Atraumatic.  Eyes: EOM are normal.  Neck: Neck supple.   Cardiovascular: Regular rhythm.   Respiratory: Breath sounds normal.  Musculoskeletal: She exhibits edema.  Neurological: She is alert and oriented to person, place, and time.  Skin: Skin is warm and dry.  Psychiatric: She has a normal mood and affect.    Prenatal labs: ABO, Rh: --/--/O POS (07/05 1845) Antibody: PENDING (07/05 1845) Rubella: Immune (11/30 0000) RPR: Nonreactive (11/30 0000)  HBsAg: Negative (11/30 0000)  HIV: Non-reactive (11/30 0000)  GBS: Negative (05/31 0000)   Assessment/Plan: Active labor Postterm P) admit routine labs amniotomy. Epidural. Pitocin prn  Stacy Vaughan A 05/06/2015, 8:01 PM

## 2015-05-06 NOTE — MAU Note (Signed)
Notified Dr. Garwin Brothers of uterine activity and cervical exam received order to discharge home.

## 2015-05-06 NOTE — Progress Notes (Signed)
S: more comfortable Epidural  O; VE 8/90/-2 IUPC replaced due to blood in tube No fluid noted with amniotomy earlier  Tracing reactive Ctx q 2-3 mins  IMP: active phase Post term ? Oligohydramnios P) start pitocin  exaggerated right sims

## 2015-05-07 ENCOUNTER — Encounter (HOSPITAL_COMMUNITY): Payer: Self-pay

## 2015-05-07 LAB — CBC
HCT: 30.3 % — ABNORMAL LOW (ref 36.0–46.0)
Hemoglobin: 9.8 g/dL — ABNORMAL LOW (ref 12.0–15.0)
MCH: 27.3 pg (ref 26.0–34.0)
MCHC: 32.3 g/dL (ref 30.0–36.0)
MCV: 84.4 fL (ref 78.0–100.0)
Platelets: 186 10*3/uL (ref 150–400)
RBC: 3.59 MIL/uL — ABNORMAL LOW (ref 3.87–5.11)
RDW: 13.8 % (ref 11.5–15.5)
WBC: 18.4 10*3/uL — AB (ref 4.0–10.5)

## 2015-05-07 LAB — RPR: RPR: NONREACTIVE

## 2015-05-07 LAB — ABO/RH: ABO/RH(D): O POS

## 2015-05-07 MED ORDER — ZOLPIDEM TARTRATE 5 MG PO TABS: 5.0000 mg | ORAL_TABLET | Freq: Every evening | ORAL | Status: DC | PRN

## 2015-05-07 MED ORDER — IBUPROFEN 600 MG PO TABS
600.0000 mg | ORAL_TABLET | Freq: Four times a day (QID) | ORAL | Status: DC
  Administered 2015-05-07 – 2015-05-08 (×6): 600 mg via ORAL
  Filled 2015-05-07 (×6): qty 1

## 2015-05-07 MED ORDER — OXYCODONE-ACETAMINOPHEN 5-325 MG PO TABS: 2.0000 | ORAL_TABLET | ORAL | Status: DC | PRN

## 2015-05-07 MED ORDER — FERROUS SULFATE 325 (65 FE) MG PO TABS
325.0000 mg | ORAL_TABLET | Freq: Two times a day (BID) | ORAL | Status: DC
  Administered 2015-05-07 – 2015-05-08 (×3): 325 mg via ORAL
  Filled 2015-05-07 (×3): qty 1

## 2015-05-07 MED ORDER — DIPHENHYDRAMINE HCL 25 MG PO CAPS: 25.0000 mg | ORAL_CAPSULE | Freq: Four times a day (QID) | ORAL | Status: DC | PRN

## 2015-05-07 MED ORDER — PRENATAL MULTIVITAMIN CH
1.0000 | ORAL_TABLET | Freq: Every day | ORAL | Status: DC
  Administered 2015-05-07: 1 via ORAL
  Filled 2015-05-07: qty 1

## 2015-05-07 MED ORDER — SENNOSIDES-DOCUSATE SODIUM 8.6-50 MG PO TABS
2.0000 | ORAL_TABLET | ORAL | Status: DC
  Administered 2015-05-07 (×2): 2 via ORAL
  Filled 2015-05-07 (×2): qty 2

## 2015-05-07 MED ORDER — BENZOCAINE-MENTHOL 20-0.5 % EX AERO
1.0000 "application " | INHALATION_SPRAY | CUTANEOUS | Status: DC | PRN
  Administered 2015-05-07: 1 via TOPICAL
  Filled 2015-05-07: qty 56

## 2015-05-07 MED ORDER — OXYCODONE-ACETAMINOPHEN 5-325 MG PO TABS: 1.0000 | ORAL_TABLET | ORAL | Status: DC | PRN

## 2015-05-07 MED ORDER — ONDANSETRON HCL 4 MG PO TABS: 4.0000 mg | ORAL_TABLET | ORAL | Status: DC | PRN

## 2015-05-07 MED ORDER — WITCH HAZEL-GLYCERIN EX PADS: 1.0000 "application " | MEDICATED_PAD | CUTANEOUS | Status: DC | PRN

## 2015-05-07 MED ORDER — SIMETHICONE 80 MG PO CHEW: 80.0000 mg | CHEWABLE_TABLET | ORAL | Status: DC | PRN

## 2015-05-07 MED ORDER — LANOLIN HYDROUS EX OINT: TOPICAL_OINTMENT | CUTANEOUS | Status: DC | PRN

## 2015-05-07 MED ORDER — ONDANSETRON HCL 4 MG/2ML IJ SOLN: 4.0000 mg | INTRAMUSCULAR | Status: DC | PRN

## 2015-05-07 MED ORDER — DIBUCAINE 1 % RE OINT: 1.0000 "application " | TOPICAL_OINTMENT | RECTAL | Status: DC | PRN

## 2015-05-07 MED ORDER — ACETAMINOPHEN 325 MG PO TABS: 650.0000 mg | ORAL_TABLET | ORAL | Status: DC | PRN

## 2015-05-07 NOTE — Lactation Note (Signed)
This note was copied from the chart of Stacy Vaughan. Lactation Consultation Note  Patient Name: Stacy Vaughan [id number] Date: 05/07/2015 Reason for consult: Follow-up assessment  2nd [place] visit for this dyad. Baby already latched by mom with depth. LC reviewed basics ,, LC encouraged breast compressions with latch  To enhance flow to baby. Multiply swallows noted . Baby fed 24 mins.  Nipple appeared normal shape when abby released.    Maternal Data Does the patient have breastfeeding experience prior to this delivery?: Yes  Feeding Feeding Type:  (per mom baby latched at 315 pm ) Length of feed: 10 min (per mom )  LATCH Score/Interventions Latch:  (latched with depth )  Audible Swallowing:  (multiply swallows , inc with breast compression ) Intervention(s): Skin to skin  Type of Nipple:  (nipple erect and normal shape when baby released )  Comfort (Breast/Nipple):  (per mom comfortable with latch )     Hold (Positioning):  (mom independent with latch)     Lactation Tools Discussed/Used WIC Program: No (per  mom )   Consult Status Consult Status: Follow-up Date: 05/08/15 Follow-up type: In-patient    Stacy Vaughan 05/07/2015, 3:49 PM

## 2015-05-07 NOTE — Progress Notes (Signed)
CLINICAL SOCIAL WORK MATERNAL/CHILD NOTE  Patient Details  Name: Stacy Vaughan MRN: [id number] Date of Birth: 05/06/2015  Date:  05/07/2015  Clinical Social Worker Initiating Note:  Lucita Ferrara, LCSW Date/ Time Initiated:  05/07/15/1330     Child's Name:  Stacy Vaughan   Legal Guardian:  Stacy Vaughan (mother) and Stacy Vaughan (father)  Need for Interpreter:  None   Date of Referral:  05/06/15     Reason for Referral:  Current Substance Use/Substance Use During Pregnancy , History of depression  Referral Source:  [facility]   Address:  [place], [place] [address]  Phone number:  [phone]   Household Members:  Minor Children, Significant Other   Natural Supports (not living in the home):  Extended Family, Immediate Family, Friends   Medical illustrator Supports: None   Employment: Full-time   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 MOB also reported history of postpartum depression for 1-2 months after first child was born.  2)Substance Use: History of marijuana use. MOB denied use during the pregnancy, reported last use approximately on year ago. Infant's UDS and MDS are pending.  Cognitive State:  Able to Concentrate , Alert , Insightful , Linear Thinking    Mood/Affect:  Bright , Happy , Interested , Calm    CSW Assessment:  CSW received request for consult due to MOB presenting with a history of depression and marijuana use.  MOB and FOB presented as easily engaged and receptive to the visit. MOB presented in a pleasant mood and  displayed a full range in affect. MOB was noted to be providing skin to skin and feeding the infant during the visit.  MOB did not present with any acute mental health symptoms, and she reported feeling comfortable as she prepares to discharge home with the infant.   MOB and FOB denied questions, concerns, or needs as they transition to the postpartum period. MOB and FOB endorsed strong support system, and discussed that they are well supported.  MOB and FOB expressed normative feelings of worry about how the MOB's first daughter will transition to becoming a big sister, but discussed their efforts to support her.  MOB denied presence of any acute stressors that may impact her transition to the postpartum period. FOB shared that the MOB is already to eager to return to work, and MOB discussed that it may difficult for her to disengage from her work identity since she enjoys working. MOB shared that it may be easier to remain in the home now that the infant has been born since she knows that she will feel busy.  MOB may require assistance postpartum to disengage from her work identity and to assist her to re-frame feeling productive while on maternity leave.   Per MOB, onset of depression as a teenager. She stated that she has a history of participating in therapy, but denied any "recent" symptoms. MOB reported postpartum depression after her first daughter was born, but also discussed how she was young and had limited support from the FOB (different FOB).  MOB shared that  she feels less overwhelmed with this transition to the postpartum period since she is older, is no longer a first time mother, and has a stronger support person.  MOB denied symptoms of depression/anxiety during the pregnancy. CSW provided education on postpartum depression and anxiety, and MOB agreed to notify her medical provider if she notes onset of symptoms. MOB acknowledged increased risk due to mental health history, and shared intention  to engage in self-care to support her mental health.   MOB reported history of marijuana use. Onset and frequency of use was not clarified, but MOB denied any substance use during the pregnancy. She stated that she last use marijuana approximately one year ago. MOB and FOB verbalized understanding of the hospital drug screen policy, and denied questions or concerns related to the infant's collection of the infant's urine and meconium.    MOB and FOB denied additional questions, concerns, or needs at this time.  Family to notify CSW if needs arise during the admission.   CSW Plan/Description:   1)Patient/Family Education: Perinatal mood and anxiety disorders, hospital drug screen policy 2) CSW to monitor infant's UDS and MDS, and will notify CPS if there is a positive drug screen. 3) MOB to notify her medical providers if she notes onset of postpartum depression/anxiety.  4)No Further Intervention Required/No Barriers to Discharge    Sharyl Nimrod 05/07/2015, 2:55 PM

## 2015-05-07 NOTE — Progress Notes (Signed)
CSW acknowledges consult for maternal history of depression and marijuana use.   CSW arrived in room to complete assessment. MOB's grandmother present in room. CSW offered to return at a later time, MOB and FOB agreeable. They stated that they will have numerous visitors for the rest of the afternoon. CSW shared that if there is an opportunity today for the assessment, CSW can meet with them early on 7/7. Family agreeable.  CSW to continue to follow.

## 2015-05-07 NOTE — Lactation Note (Signed)
This note was copied from the chart of Girl Kymberley Raz. Lactation Consultation Note  Patient Name: Girl Sofi Bryars [id number] Date: 05/07/2015 Reason for consult: Initial assessment (baby presently skin to skin after breast feeding at 1315 )  Baby is 19 hour old has been to the breast several times for 10 -30 mins , Latch score range 8-9  Has voided , no stool as of yet. LC reviewed the importance of skin to skin, which mom is skin to skin with baby presently  Sound asleep. LC recommended mom to call when abby showing feeding cues. Mother informed of post-discharge support and given phone number to the lactation department, including services for phone  call assistance; out-patient appointments; and breastfeeding support group. List of other breastfeeding resources in the community  given in the handout. Encouraged mother to call for problems or concerns related to breastfeeding.   Maternal Data    Feeding Feeding Type:  (per mom baby recently breast fed at 1315 for 10 mins . ) Length of feed: 10 min (per mom )  LATCH Score/Interventions Latch: Grasps breast easily, tongue down, lips flanged, rhythmical sucking.  Audible Swallowing: A few with stimulation Intervention(s): Hand expression;Skin to skin  Type of Nipple: Everted at rest and after stimulation  Comfort (Breast/Nipple): Soft / non-tender     Hold (Positioning): No assistance needed to correctly position infant at breast.  LATCH Score: 9  Lactation Tools Discussed/Used WIC Program: No (per  mom )   Consult Status Consult Status: Follow-up Date: 05/07/15 Follow-up type: In-patient    Myer Haff 05/07/2015, 1:56 PM

## 2015-05-07 NOTE — Progress Notes (Signed)
PPD #1- SVD  Subjective:   Reports feeling well Tolerating po/ No nausea or vomiting Bleeding is light Pain controlled with Motrin Up ad lib / ambulatory / voiding without problems Newborn: breastfeeding     Objective:   VS:  VS:  Filed Vitals:   05/06/15 2301 05/06/15 2342 05/07/15 0045 05/07/15 0500  BP: 114/69 124/71 124/66 117/66  Pulse: 86 71 85 78  Temp:  98.1 F (36.7 C) 98.5 F (36.9 C) 98.3 F (36.8 C)  TempSrc:  Oral Oral Oral  Resp: 18 18 18 18   SpO2:  100% 97% 98%    LABS:  Recent Labs  05/06/15 1845 05/07/15 0535  WBC 18.6* 18.4*  HGB 11.2* 9.8*  PLT 193 186   Blood type: --/--/O POS, O POS (07/05 1845) Rubella: Immune (11/30 0000)   I&O: Intake/Output      07/05 0701 - 07/06 0700 07/06 0701 - 07/07 0700   Urine 0    Blood 250    Total Output 250     Net -250          Urine Occurrence 1 x      Physical Exam: Alert and oriented x3 Abdomen: soft, non-tender, non-distended  Fundus: firm, non-tender, U-2 Perineum: Well approximated, no significant erythema, edema, or drainage; healing well. Ice pk in place. Lochia: small Extremities: No edema, no calf pain or tenderness    Assessment:  PPD #1 G2P2001/ S/P:spontaneous vaginal, labial laceration ABL anemia  Doing well    Plan: Continue routine post partum orders Anticipate D/C home tomorrow   Stacy Vaughan, N MSN, CNM 05/07/2015, 10:33 AM

## 2015-05-07 NOTE — Anesthesia Postprocedure Evaluation (Signed)
Anesthesia Post Note  Patient: Stacy Vaughan  Procedure(s) Performed: * No procedures listed *  Anesthesia type: Epidural  Patient location: Mother/Baby  Post pain: Pain level controlled  Post assessment: Post-op Vital signs reviewed  Last Vitals:  Filed Vitals:   05/07/15 0500  BP: 117/66  Pulse: 78  Temp: 36.8 C  Resp: 18    Post vital signs: Reviewed  Level of consciousness: awake  Complications: No apparent anesthesia complications

## 2015-05-08 MED ORDER — HYDROCORTISONE ACE-PRAMOXINE 2.5-1 % EX CREA: 1.0000 "application " | TOPICAL_CREAM | Freq: Three times a day (TID) | CUTANEOUS | Status: DC | PRN

## 2015-05-08 MED ORDER — IBUPROFEN 600 MG PO TABS: 600.0000 mg | ORAL_TABLET | Freq: Four times a day (QID) | ORAL | Status: DC

## 2015-05-08 NOTE — Discharge Summary (Signed)
Obstetric Discharge Summary  Reason for Admission: onset of labor Prenatal Procedures: none Intrapartum Procedures: spontaneous vaginal delivery Postpartum Procedures: none Complications-Operative and Postpartum: labial laceration repaired HEMOGLOBIN  Date Value Ref Range Status  05/07/2015 9.8* 12.0 - 15.0 g/dL Final   HCT  Date Value Ref Range Status  05/07/2015 30.3* 36.0 - 46.0 % Final    Physical Exam:  General: alert, cooperative and no distress Lochia: appropriate Uterine Fundus: firm Incision: healing well DVT Evaluation: No evidence of DVT seen on physical exam.  Discharge Diagnoses: Term Pregnancy-delivered  Discharge Information: Date: 05/08/2015 Activity: pelvic rest Diet: routine Medications: PNV, Ibuprofen and iron from home and pramoxine w hydrocortisone for hemorrhoids Condition: stable Instructions: refer to practice specific booklet Discharge to: home Follow-up Information    Follow up with COUSINS,SHERONETTE A, MD. Schedule an appointment as soon as possible for a visit in 6 weeks.   Specialty:  Obstetrics and Gynecology   Contact information:   [address] [place] [address] [phone]       Newborn Data: Live born female  Birth Weight: 8 lb 9.9 oz (3910 g) APGAR: 7, 8  Home with mother.  Artelia Laroche 05/08/2015, 8:32 AM

## 2015-05-08 NOTE — Progress Notes (Signed)
PPD 2 SVD  S:  Reports feeling well             Tolerating po/ No nausea or vomiting             Bleeding is light             Pain controlled with motrin only             Up ad lib / ambulatory / voiding QS  Newborn breast feeding   O:               VS: BP 115/76 mmHg  Pulse 60  Temp(Src) 98.4 F (36.9 C) (Oral)  Resp 18  SpO2 99%  Breastfeeding? Unknown   LABS:              Recent Labs  05/06/15 1845 05/07/15 0535  WBC 18.6* 18.4*  HGB 11.2* 9.8*  PLT 193 186               Blood type: --/--/O POS, O POS (07/05 1845)  Rubella: Immune (11/30 0000)                           Physical Exam:             Alert and oriented X3  Lungs: Clear and unlabored  Heart: regular rate and rhythm / no mumurs  Abdomen: soft, non-tender, non-distended              Fundus: firm, non-tender, U-1  Perineum: no edema  Lochia: light   Extremities: no edema, no calf pain or tenderness    A: PPD # 2   Doing well - stable status  P: Routine post partum orders  DC home / WOB booklet - instructions reviewed  Artelia Laroche CNM, MSN, [facility] 05/08/2015, 7:39 AM

## 2015-06-02 ENCOUNTER — Encounter: Payer: Self-pay | Admitting: Internal Medicine

## 2015-06-02 ENCOUNTER — Ambulatory Visit (INDEPENDENT_AMBULATORY_CARE_PROVIDER_SITE_OTHER): Payer: Managed Care, Other (non HMO) | Admitting: Internal Medicine

## 2015-06-02 VITALS — BP 116/66 | HR 76 | Temp 97.4°F | Resp 16 | Ht 68.0 in | Wt 173.8 lb

## 2015-06-02 DIAGNOSIS — D509 Iron deficiency anemia, unspecified: Secondary | ICD-10-CM

## 2015-06-02 DIAGNOSIS — Z111 Encounter for screening for respiratory tuberculosis: Secondary | ICD-10-CM

## 2015-06-02 DIAGNOSIS — O26819 Pregnancy related exhaustion and fatigue, unspecified trimester: Secondary | ICD-10-CM

## 2015-06-02 DIAGNOSIS — E559 Vitamin D deficiency, unspecified: Secondary | ICD-10-CM

## 2015-06-02 DIAGNOSIS — Z0000 Encounter for general adult medical examination without abnormal findings: Secondary | ICD-10-CM

## 2015-06-02 NOTE — Progress Notes (Signed)
Patient ID: Stacy Vaughan, female   DOB: 06-May-1991, 24 y.o.   MRN: [id number]   Comprehensive Examination    This very nice 24 y.o.female presents for complete physical. Patient is 4 weeks post-partum and nursing  a 22 week old 2sd daughter (1st daughter is 96 yo). Patient does report non-specific fatigue.   Patient denies reactive hypoglycemic symptoms, visual blurring, diabetic polys, or paresthesias. Last A1c was  5.5% on May 30, 2014. Patient has been followed for  Vitamin D Deficiency. Patient also has hx/o ADD and has deferred treatment during pregnancy and nursing. Patient requests exam of a mole of her left axilla.       Patient's last lipids were at goal with T Chol 165, HDL 72, LDL 72, Trig 103 And Chol/HDL ratio 2.3. Finally, patient has history of Vitamin D Deficiency and last Vitamin D was  26 on 11/07/2014.      Medication Sig  . ibuprofen (ADVIL,MOTRIN) 600 MG tablet Take 1 tablet (600 mg total) by mouth every 6 (six) hours.  . Pramoxine-HC (HYDROCORTISONE ACE-PRAMOXINE) 2.5-1 % CREA Apply 1 application topically 3 (three) times daily as needed (hemorrhoidal pain).   No Known Allergies   Past Medical History  Diagnosis Date  . ADD (attention deficit disorder)   . Scoliosis   . MRSA (methicillin resistant staph aureus) culture positive 4 YEARS AGO   Health Maintenance  Topic Date Due  . PAP SMEAR  03/21/2009  . TETANUS/TDAP  03/21/2010  . INFLUENZA VACCINE  06/02/2015  . HIV Screening  Completed   Immunization History  Administered Date(s) Administered  . DTaP 11/01/2013  . PPD Test 05/30/2014, 06/02/2015  . Td 06/01/2006   Past Surgical History  Procedure Laterality Date  . Cholecystectomy  2003  . Wisdom tooth extraction  2008   Family History  Problem Relation Age of Onset  . Migraines Mother   . Hyperlipidemia Father    History  Substance Use Topics  . Smoking status: Former Research scientist (life sciences)  . Smokeless tobacco: Not on file     Comment: Patient quit due to  pregnancy and does not intend to restart.  . Alcohol Use: No     Comment: occasionally    ROS Constitutional: Denies fever, chills, weight loss/gain, headaches, insomnia,  night sweats, and change in appetite. Does c/o fatigue. Eyes: Denies redness, blurred vision, diplopia, discharge, itchy, watery eyes.  ENT: Denies discharge, congestion, post nasal drip, epistaxis, sore throat, earache, hearing loss, dental pain, Tinnitus, Vertigo, Sinus pain, snoring.  Cardio: Denies chest pain, palpitations, irregular heartbeat, syncope, dyspnea, diaphoresis, orthopnea, PND, claudication, edema Respiratory: denies cough, dyspnea, DOE, pleurisy, hoarseness, laryngitis, wheezing.  Gastrointestinal: Denies dysphagia, heartburn, reflux, water brash, pain, cramps, nausea, vomiting, bloating, diarrhea, constipation, hematemesis, melena, hematochezia, jaundice, hemorrhoids Genitourinary: Denies dysuria, frequency, urgency, nocturia, hesitancy, discharge, hematuria, flank pain Breast: Breast lumps, nipple discharge, bleeding.  Musculoskeletal: Denies arthralgia, myalgia, stiffness, Jt. Swelling, pain, limp, and strain/sprain. Denies falls. Skin: Denies puritis, rash, hives, warts, acne, eczema, changing in skin lesion Neuro: No weakness, tremor, incoordination, spasms, paresthesia, pain Psychiatric: Denies confusion, memory loss, sensory loss. Denies Depression. Endocrine: Denies change in weight, skin, hair change, nocturia, and paresthesia, diabetic polys, visual blurring, hyper / hypo glycemic episodes.  Heme/Lymph: No excessive bleeding, bruising, enlarged lymph nodes.  Physical Exam  BP 116/66   Pulse 76  Temp 97.4 F   Resp 16  Ht 5\' 8"    Wt 173 lb 12.8 oz    BMI 26.43   General  Appearance: Well nourished and in no apparent distress. Eyes: PERRLA, EOMs, conjunctiva no swelling or erythema, normal fundi and vessels. Sinuses: No frontal/maxillary tenderness ENT/Mouth: EACs patent / TMs  nl. Nares  clear without erythema, swelling, mucoid exudates. Oral hygiene is good. No erythema, swelling, or exudate. Tongue normal, non-obstructing. Tonsils not swollen or erythematous. Hearing normal.  Neck: Supple, thyroid normal. No bruits, nodes or JVD. Respiratory: Respiratory effort normal.  BS equal and clear bilateral without rales, rhonci, wheezing or stridor. Cardio: Heart sounds are normal with regular rate and rhythm and no murmurs, rubs or gallops. Peripheral pulses are normal and equal bilaterally without edema. No aortic or femoral bruits. Chest: symmetric with normal excursions and percussion. Breasts:  Deferred to Dr Garwin Brothers Abd: Flat, soft, with bowel sounds. Nontender, no guarding, rebound, hernias, masses, or organomegaly.  Lymphatics: Non tender without lymphadenopathy.  Genitourinary: Deferred to Dr Garwin Brothers. Musculoskeletal: Full ROM all peripheral extremities, joint stability, 5/5 strength, and normal gait. Mild kyphoscoliosis.  Skin: Warm and dry without rashes,cyanosis, clubbing or  ecchymosis. 5 mm flat dark brown lesion with hazy margins on right mid to lower back 2" below the tip of tatoo. Also, there's a 5-6 mm med / dark brown raised lesion of the left axilla. Neuro: Cranial nerves intact, reflexes equal bilaterally. Normal muscle tone, no cerebellar symptoms. Sensation intact.  Pysch: Awake and oriented X 3, normal affect, Insight and Judgment appropriate.   Assessment and Plan  1. Routine general medical examination at a health care facility  - Urine Microscopic - CBC with Differential/Platelet - BASIC METABOLIC PANEL WITH GFR  2. Fatigue during pregnancy, delivered  - TSH - Hemoglobin A1c - Insulin, random - Vitamin B12  3. Anemia, iron deficiency  - Iron and TIBC  4. Vitamin D deficiency  - Vit D  25 hydroxy   5. ADD   6. Abnormal nevi Left axilla & lower left back     Continue prudent diet as discussed, weight control, BP monitoring, regular  exercise, and medications. Discussed med's effects and SE's. Screening labs and tests as requested with regular follow-up as recommended.  Over 40 minutes of exam, counseling, chart review was performed.

## 2015-06-02 NOTE — Patient Instructions (Signed)
Recommend Adult Low dose Aspirin or coated  Aspirin 81 mg daily   To reduce risk of Colon Cancer 20 %,   Skin Cancer 26 % ,   Melanoma 46%   and   Pancreatic cancer 60% ++++++++++++++++++ Vitamin D goal is between 70-100.   Please make sure that you are taking your Vitamin D as directed.   It is very important as a natural anti-inflammatory   helping hair, skin, and nails, as well as reducing stroke and heart attack risk.   It helps your bones and helps with mood.  It also decreases numerous cancer risks so please take it as directed.   Low Vit D is associated with a 200-300% higher risk for CANCER   and 200-300% higher risk for HEART   ATTACK  &  STROKE.   ......................................  It is also associated with higher death rate at younger ages,   autoimmune diseases like Rheumatoid arthritis, Lupus, Multiple Sclerosis.     Also many other serious conditions, like depression, Alzheimer's  Dementia, infertility, muscle aches, fatigue, fibromyalgia - just to name a few.  +++++++++++++++++++  Recommend the book "The END of DIETING" by Dr Joel Fuhrman   & the book "The END of DIABETES " by Dr Joel Fuhrman  At [employer] - get book & Audio CD's     Being diabetic has a  300% increased risk for heart attack, stroke, cancer, and alzheimer- type vascular dementia. It is very important that you work harder with diet by avoiding all foods that are white. Avoid white rice (brown & wild rice is OK), white potatoes (sweetpotatoes in moderation is OK), White bread or wheat bread or anything made out of white flour like bagels, donuts, rolls, buns, biscuits, cakes, pastries, cookies, pizza crust, and pasta (made from white flour & egg whites) - vegetarian pasta or spinach or wheat pasta is OK. Multigrain breads like Arnold's or [employer], or multigrain sandwich thins or flatbreads.  Diet, exercise and weight loss can reverse and cure diabetes in the early stages.   Diet, exercise and weight loss is very important in the control and prevention of complications of diabetes which affects every system in your body, ie. Brain - dementia/stroke, eyes - glaucoma/blindness, heart - heart attack/heart failure, kidneys - dialysis, stomach - gastric paralysis, intestines - malabsorption, nerves - severe painful neuritis, circulation - gangrene & loss of a leg(s), and finally cancer and Alzheimers.    I recommend avoid fried & greasy foods,  sweets/candy, white rice (brown or wild rice or Quinoa is OK), white potatoes (sweet potatoes are OK) - anything made from white flour - bagels, doughnuts, rolls, buns, biscuits,white and wheat breads, pizza crust and traditional pasta made of white flour & egg white(vegetarian pasta or spinach or wheat pasta is OK).  Multi-grain bread is OK - like multi-grain flat bread or sandwich thins. Avoid alcohol in excess. Exercise is also important.    Eat all the vegetables you want - avoid meat, especially red meat and dairy - especially cheese.  Cheese is the most concentrated form of trans-fats which is the worst thing to clog up our arteries. Veggie cheese is OK which can be found in the fresh produce section at [employer] or [employer] or [employer]  ++++++++++++++++++++++++++   Preventive Care for Adults  A healthy lifestyle and preventive care can promote health and wellness. Preventive health guidelines for women include the following key practices.  A routine yearly physical is a good way   to check with your health care provider about your health and preventive screening. It is a chance to share any concerns and updates on your health and to receive a thorough exam.  Visit your dentist for a routine exam and preventive care every 6 months. Brush your teeth twice a day and floss once a day. Good oral hygiene prevents tooth decay and gum disease.  The frequency of eye exams is based on your age, health, family medical history, use  of contact lenses, and other factors. Follow your health care provider's recommendations for frequency of eye exams.  Eat a healthy diet. Foods like vegetables, fruits, whole grains, low-fat dairy products, and lean protein foods contain the nutrients you need without too many calories. Decrease your intake of foods high in solid fats, added sugars, and salt. Eat the right amount of calories for you.Get information about a proper diet from your health care provider, if necessary.  Regular physical exercise is one of the most important things you can do for your health. Most adults should get at least 150 minutes of moderate-intensity exercise (any activity that increases your heart rate and causes you to sweat) each week. In addition, most adults need muscle-strengthening exercises on 2 or more days a week.  Maintain a healthy weight. The body mass index (BMI) is a screening tool to identify possible weight problems. It provides an estimate of body fat based on height and weight. Your health care provider can find your BMI and can help you achieve or maintain a healthy weight.For adults 20 years and older:  A BMI below 18.5 is considered underweight.  A BMI of 18.5 to 24.9 is normal.  A BMI of 25 to 29.9 is considered overweight.  A BMI of 30 and above is considered obese.  Maintain normal blood lipids and cholesterol levels by exercising and minimizing your intake of saturated fat. Eat a balanced diet with plenty of fruit and vegetables. Blood tests for lipids and cholesterol should begin at age 24 and be repeated every 5 years. If your lipid or cholesterol levels are high, you are over 50, or you are at high risk for heart disease, you may need your cholesterol levels checked more frequently.Ongoing high lipid and cholesterol levels should be treated with medicines if diet and exercise are not working.  If you smoke, find out from your health care provider how to quit. If you do not use  tobacco, do not start.  Lung cancer screening is recommended for adults aged 75-80 years who are at high risk for developing lung cancer because of a history of smoking. A yearly low-dose CT scan of the lungs is recommended for people who have at least a 30-pack-year history of smoking and are a current smoker or have quit within the past 15 years. A pack year of smoking is smoking an average of 1 pack of cigarettes a day for 1 year (for example: 1 pack a day for 30 years or 2 packs a day for 15 years). Yearly screening should continue until the smoker has stopped smoking for at least 15 years. Yearly screening should be stopped for people who develop a health problem that would prevent them from having lung cancer treatment.  If you are pregnant, do not drink alcohol. If you are breastfeeding, be very cautious about drinking alcohol. If you are not pregnant and choose to drink alcohol, do not have more than 1 drink per day. One drink is considered to be 12 ounces (  355 mL) of beer, 5 ounces (148 mL) of wine, or 1.5 ounces (44 mL) of liquor.  Avoid use of street drugs. Do not share needles with anyone. Ask for help if you need support or instructions about stopping the use of drugs.  High blood pressure causes heart disease and increases the risk of stroke. Your blood pressure should be checked at least every 1 to 2 years. Ongoing high blood pressure should be treated with medicines if weight loss and exercise do not work.  If you are 48-78 years old, ask your health care provider if you should take aspirin to prevent strokes.  Diabetes screening involves taking a blood sample to check your fasting blood sugar level. This should be done once every 3 years, after age 80, if you are within normal weight and without risk factors for diabetes. Testing should be considered at a younger age or be carried out more frequently if you are overweight and have at least 1 risk factor for diabetes.  Breast cancer  screening is essential preventive care for women. You should practice "breast self-awareness." This means understanding the normal appearance and feel of your breasts and may include breast self-examination. Any changes detected, no matter how small, should be reported to a health care provider. Women in their 79s and 30s should have a clinical breast exam (CBE) by a health care provider as part of a regular health exam every 1 to 3 years. After age 46, women should have a CBE every year. Starting at age 5, women should consider having a mammogram (breast X-ray test) every year. Women who have a family history of breast cancer should talk to their health care provider about genetic screening. Women at a high risk of breast cancer should talk to their health care providers about having an MRI and a mammogram every year.  Breast cancer gene (BRCA)-related cancer risk assessment is recommended for women who have family members with BRCA-related cancers. BRCA-related cancers include breast, ovarian, tubal, and peritoneal cancers. Having family members with these cancers may be associated with an increased risk for harmful changes (mutations) in the breast cancer genes BRCA1 and BRCA2. Results of the assessment will determine the need for genetic counseling and BRCA1 and BRCA2 testing.  Routine pelvic exams to screen for cancer are no longer recommended for nonpregnant women who are considered low risk for cancer of the pelvic organs (ovaries, uterus, and vagina) and who do not have symptoms. Ask your health care provider if a screening pelvic exam is right for you.  If you have had past treatment for cervical cancer or a condition that could lead to cancer, you need Pap tests and screening for cancer for at least 20 years after your treatment. If Pap tests have been discontinued, your risk factors (such as having a new sexual partner) need to be reassessed to determine if screening should be resumed. Some women  have medical problems that increase the chance of getting cervical cancer. In these cases, your health care provider may recommend more frequent screening and Pap tests.  The HPV test is an additional test that may be used for cervical cancer screening. The HPV test looks for the virus that can cause the cell changes on the cervix. The cells collected during the Pap test can be tested for HPV. The HPV test could be used to screen women aged 25 years and older, and should be used in women of any age who have unclear Pap test results. After  the age of 64, women should have HPV testing at the same frequency as a Pap test.  Colorectal cancer can be detected and often prevented. Most routine colorectal cancer screening begins at the age of 109 years and continues through age 90 years. However, your health care provider may recommend screening at an earlier age if you have risk factors for colon cancer. On a yearly basis, your health care provider may provide home test kits to check for hidden blood in the stool. Use of a small camera at the end of a tube, to directly examine the colon (sigmoidoscopy or colonoscopy), can detect the earliest forms of colorectal cancer. Talk to your health care provider about this at age 81, when routine screening begins. Direct exam of the colon should be repeated every 5-10 years through age 14 years, unless early forms of pre-cancerous polyps or small growths are found.  People who are at an increased risk for hepatitis B should be screened for this virus. You are considered at high risk for hepatitis B if:  You were born in a country where hepatitis B occurs often. Talk with your health care provider about which countries are considered high risk.  Your parents were born in a high-risk country and you have not received a shot to protect against hepatitis B (hepatitis B vaccine).  You have HIV or AIDS.  You use needles to inject street drugs.  You live with, or have sex  with, someone who has hepatitis B.  You get hemodialysis treatment.  You take certain medicines for conditions like cancer, organ transplantation, and autoimmune conditions.  Hepatitis C blood testing is recommended for all people born from [birth year] through 1965 and any individual with known risks for hepatitis C.  Practice safe sex. Use condoms and avoid high-risk sexual practices to reduce the spread of sexually transmitted infections (STIs). STIs include gonorrhea, chlamydia, syphilis, trichomonas, herpes, HPV, and human immunodeficiency virus (HIV). Herpes, HIV, and HPV are viral illnesses that have no cure. They can result in disability, cancer, and death.  You should be screened for sexually transmitted illnesses (STIs) including gonorrhea and chlamydia if:  You are sexually active and are younger than 24 years.  You are older than 24 years and your health care provider tells you that you are at risk for this type of infection.  Your sexual activity has changed since you were last screened and you are at an increased risk for chlamydia or gonorrhea. Ask your health care provider if you are at risk.  If you are at risk of being infected with HIV, it is recommended that you take a prescription medicine daily to prevent HIV infection. This is called preexposure prophylaxis (PrEP). You are considered at risk if:  You are a heterosexual woman, are sexually active, and are at increased risk for HIV infection.  You take drugs by injection.  You are sexually active with a partner who has HIV.  Talk with your health care provider about whether you are at high risk of being infected with HIV. If you choose to begin PrEP, you should first be tested for HIV. You should then be tested every 3 months for as long as you are taking PrEP.  Osteoporosis is a disease in which the bones lose minerals and strength with aging. This can result in serious bone fractures or breaks. The risk of osteoporosis can  be identified using a bone density scan. Women ages 64 years and over and women at risk  for fractures or osteoporosis should discuss screening with their health care providers. Ask your health care provider whether you should take a calcium supplement or vitamin D to reduce the rate of osteoporosis.  Menopause can be associated with physical symptoms and risks. Hormone replacement therapy is available to decrease symptoms and risks. You should talk to your health care provider about whether hormone replacement therapy is right for you.  Use sunscreen. Apply sunscreen liberally and repeatedly throughout the day. You should seek shade when your shadow is shorter than you. Protect yourself by wearing long sleeves, pants, a wide-brimmed hat, and sunglasses year round, whenever you are outdoors.  Once a month, do a whole body skin exam, using a mirror to look at the skin on your back. Tell your health care provider of new moles, moles that have irregular borders, moles that are larger than a pencil eraser, or moles that have changed in shape or color.  Stay current with required vaccines (immunizations).  Influenza vaccine. All adults should be immunized every year.  Tetanus, diphtheria, and acellular pertussis (Td, Tdap) vaccine. Pregnant women should receive 1 dose of Tdap vaccine during each pregnancy. The dose should be obtained regardless of the length of time since the last dose. Immunization is preferred during the 27th-36th week of gestation. An adult who has not previously received Tdap or who does not know her vaccine status should receive 1 dose of Tdap. This initial dose should be followed by tetanus and diphtheria toxoids (Td) booster doses every 10 years. Adults with an unknown or incomplete history of completing a 3-dose immunization series with Td-containing vaccines should begin or complete a primary immunization series including a Tdap dose. Adults should receive a Td booster every 10  years.  Varicella vaccine. An adult without evidence of immunity to varicella should receive 2 doses or a second dose if she has previously received 1 dose. Pregnant females who do not have evidence of immunity should receive the first dose after pregnancy. This first dose should be obtained before leaving the health care facility. The second dose should be obtained 4-8 weeks after the first dose.  Human papillomavirus (HPV) vaccine. Females aged 13-26 years who have not received the vaccine previously should obtain the 3-dose series. The vaccine is not recommended for use in pregnant females. However, pregnancy testing is not needed before receiving a dose. If a female is found to be pregnant after receiving a dose, no treatment is needed. In that case, the remaining doses should be delayed until after the pregnancy. Immunization is recommended for any person with an immunocompromised condition through the age of 23 years if she did not get any or all doses earlier. During the 3-dose series, the second dose should be obtained 4-8 weeks after the first dose. The third dose should be obtained 24 weeks after the first dose and 16 weeks after the second dose.  Zoster vaccine. One dose is recommended for adults aged 27 years or older unless certain conditions are present.  Measles, mumps, and rubella (MMR) vaccine. Adults born before [birth year] generally are considered immune to measles and mumps. Adults born in [birth year] or later should have 1 or more doses of MMR vaccine unless there is a contraindication to the vaccine or there is laboratory evidence of immunity to each of the three diseases. A routine second dose of MMR vaccine should be obtained at least 28 days after the first dose for students attending postsecondary schools, health care workers, or international travelers.  People who received inactivated measles vaccine or an unknown type of measles vaccine during 1963-1967 should receive 2 doses of MMR vaccine.  People who received inactivated mumps vaccine or an unknown type of mumps vaccine before 1979 and are at high risk for mumps infection should consider immunization with 2 doses of MMR vaccine. For females of childbearing age, rubella immunity should be determined. If there is no evidence of immunity, females who are not pregnant should be vaccinated. If there is no evidence of immunity, females who are pregnant should delay immunization until after pregnancy. Unvaccinated health care workers born before [birth year] who lack laboratory evidence of measles, mumps, or rubella immunity or laboratory confirmation of disease should consider measles and mumps immunization with 2 doses of MMR vaccine or rubella immunization with 1 dose of MMR vaccine.  Pneumococcal 13-valent conjugate (PCV13) vaccine. When indicated, a person who is uncertain of her immunization history and has no record of immunization should receive the PCV13 vaccine. An adult aged 45 years or older who has certain medical conditions and has not been previously immunized should receive 1 dose of PCV13 vaccine. This PCV13 should be followed with a dose of pneumococcal polysaccharide (PPSV23) vaccine. The PPSV23 vaccine dose should be obtained at least 8 weeks after the dose of PCV13 vaccine. An adult aged 28 years or older who has certain medical conditions and previously received 1 or more doses of PPSV23 vaccine should receive 1 dose of PCV13. The PCV13 vaccine dose should be obtained 1 or more years after the last PPSV23 vaccine dose.  Pneumococcal polysaccharide (PPSV23) vaccine. When PCV13 is also indicated, PCV13 should be obtained first. All adults aged 38 years and older should be immunized. An adult younger than age 85 years who has certain medical conditions should be immunized. Any person who resides in a nursing home or long-term care facility should be immunized. An adult smoker should be immunized. People with an immunocompromised condition and  certain other conditions should receive both PCV13 and PPSV23 vaccines. People with human immunodeficiency virus (HIV) infection should be immunized as soon as possible after diagnosis. Immunization during chemotherapy or radiation therapy should be avoided. Routine use of PPSV23 vaccine is not recommended for American Indians, [place]atives, or people younger than 65 years unless there are medical conditions that require PPSV23 vaccine. When indicated, people who have unknown immunization and have no record of immunization should receive PPSV23 vaccine. One-time revaccination 5 years after the first dose of PPSV23 is recommended for people aged 19-64 years who have chronic kidney failure, nephrotic syndrome, asplenia, or immunocompromised conditions. People who received 1-2 doses of PPSV23 before age 37 years should receive another dose of PPSV23 vaccine at age 25 years or later if at least 5 years have passed since the previous dose. Doses of PPSV23 are not needed for people immunized with PPSV23 at or after age 79 years.  Meningococcal vaccine. Adults with asplenia or persistent complement component deficiencies should receive 2 doses of quadrivalent meningococcal conjugate (MenACWY-D) vaccine. The doses should be obtained at least 2 months apart. Microbiologists working with certain meningococcal bacteria, [employer] recruits, people at risk during an outbreak, and people who travel to or live in countries with a high rate of meningitis should be immunized. A first-year college student up through age 76 years who is living in a residence hall should receive a dose if she did not receive a dose on or after her 16th birthday. Adults who have certain high-risk conditions should receive  one or more doses of vaccine.  Hepatitis A vaccine. Adults who wish to be protected from this disease, have certain high-risk conditions, work with hepatitis A-infected animals, work in hepatitis A research labs, or travel to or  work in countries with a high rate of hepatitis A should be immunized. Adults who were previously unvaccinated and who anticipate close contact with an international adoptee during the first 60 days after arrival in the [place] from a country with a high rate of hepatitis A should be immunized.  Hepatitis B vaccine. Adults who wish to be protected from this disease, have certain high-risk conditions, may be exposed to blood or other infectious body fluids, are household contacts or sex partners of hepatitis B positive people, are clients or workers in certain care facilities, or travel to or work in countries with a high rate of hepatitis B should be immunized.  Haemophilus influenzae type b (Hib) vaccine. A previously unvaccinated person with asplenia or sickle cell disease or having a scheduled splenectomy should receive 1 dose of Hib vaccine. Regardless of previous immunization, a recipient of a hematopoietic stem cell transplant should receive a 3-dose series 6-12 months after her successful transplant. Hib vaccine is not recommended for adults with HIV infection. Preventive Services / Frequency  Ages 70 to 22 years  Blood pressure check.  Lipid and cholesterol check.  Clinical breast exam.** / Every 3 years for women in their 3s and 40s.  BRCA-related cancer risk assessment.** / For women who have family members with a BRCA-related cancer (breast, ovarian, tubal, or peritoneal cancers).  Pap test.** / Every 2 years from ages 24 through 14. Every 3 years starting at age 17 through age 37 or 68 with a history of 3 consecutive normal Pap tests.  HPV screening.** / Every 3 years from ages 8 through ages 48 to 66 with a history of 3 consecutive normal Pap tests.  Hepatitis C blood test.** / For any individual with known risks for hepatitis C.  Skin self-exam. / Monthly.  Influenza vaccine. / Every year.  Tetanus, diphtheria, and acellular pertussis (Tdap, Td) vaccine.** / Consult  your health care provider. Pregnant women should receive 1 dose of Tdap vaccine during each pregnancy. 1 dose of Td every 10 years.  Varicella vaccine.** / Consult your health care provider. Pregnant females who do not have evidence of immunity should receive the first dose after pregnancy.  HPV vaccine. / 3 doses over 6 months, if 28 and younger. The vaccine is not recommended for use in pregnant females. However, pregnancy testing is not needed before receiving a dose.  Measles, mumps, rubella (MMR) vaccine.** / You need at least 1 dose of MMR if you were born in 1957 or later. You may also need a 2nd dose. For females of childbearing age, rubella immunity should be determined. If there is no evidence of immunity, females who are not pregnant should be vaccinated. If there is no evidence of immunity, females who are pregnant should delay immunization until after pregnancy.  Pneumococcal 13-valent conjugate (PCV13) vaccine.** / Consult your health care provider.  Pneumococcal polysaccharide (PPSV23) vaccine.** / 1 to 2 doses if you smoke cigarettes or if you have certain conditions.  Meningococcal vaccine.** / 1 dose if you are age 27 to 35 years and a Market researcher living in a residence hall, or have one of several medical conditions, you need to get vaccinated against meningococcal disease. You may also need additional booster doses.  Hepatitis A  vaccine.** / Consult your health care provider.  Hepatitis B vaccine.** / Consult your health care provider.  Haemophilus influenzae type b (Hib) vaccine.** / Consult your health care provider.

## 2015-06-03 LAB — HEMOGLOBIN A1C
HEMOGLOBIN A1C: 5.6 % (ref <5.7)
MEAN PLASMA GLUCOSE: 114 mg/dL (ref <117)

## 2015-06-03 LAB — CBC WITH DIFFERENTIAL/PLATELET
BASOS PCT: 0 % (ref 0–1)
Basophils Absolute: 0 10*3/uL (ref 0.0–0.1)
EOS ABS: 0.2 10*3/uL (ref 0.0–0.7)
Eosinophils Relative: 3 % (ref 0–5)
HEMATOCRIT: 41.6 % (ref 36.0–46.0)
Hemoglobin: 13.3 g/dL (ref 12.0–15.0)
LYMPHS PCT: 31 % (ref 12–46)
Lymphs Abs: 2.3 10*3/uL (ref 0.7–4.0)
MCH: 26.8 pg (ref 26.0–34.0)
MCHC: 32 g/dL (ref 30.0–36.0)
MCV: 83.9 fL (ref 78.0–100.0)
MONOS PCT: 5 % (ref 3–12)
MPV: 9.8 fL (ref 8.6–12.4)
Monocytes Absolute: 0.4 10*3/uL (ref 0.1–1.0)
Neutro Abs: 4.5 10*3/uL (ref 1.7–7.7)
Neutrophils Relative %: 61 % (ref 43–77)
PLATELETS: 246 10*3/uL (ref 150–400)
RBC: 4.96 MIL/uL (ref 3.87–5.11)
RDW: 15.2 % (ref 11.5–15.5)
WBC: 7.3 10*3/uL (ref 4.0–10.5)

## 2015-06-03 LAB — BASIC METABOLIC PANEL WITH GFR
BUN: 12 mg/dL (ref 7–25)
CALCIUM: 9.3 mg/dL (ref 8.6–10.2)
CHLORIDE: 105 mmol/L (ref 98–110)
CO2: 24 mmol/L (ref 20–31)
Creat: 0.93 mg/dL (ref 0.50–1.10)
GFR, Est African American: >89 mL/min (ref >=60)
GFR, Est Non African American: 86 mL/min (ref >=60)
Glucose, Bld: 80 mg/dL (ref 65–99)
Potassium: 4.4 mmol/L (ref 3.5–5.3)
Sodium: 141 mmol/L (ref 135–146)

## 2015-06-03 LAB — URINALYSIS, MICROSCOPIC ONLY
BACTERIA UA: NONE SEEN [HPF]
CASTS: NONE SEEN [LPF]
Crystals: NONE SEEN [HPF]
RBC / HPF: NONE SEEN RBC/HPF (ref <=2)
Yeast: NONE SEEN [HPF]

## 2015-06-03 LAB — TSH: TSH: 1.702 u[IU]/mL (ref 0.350–4.500)

## 2015-06-03 LAB — IRON AND TIBC
%SAT: 20 % (ref 20–55)
Iron: 69 ug/dL (ref 42–145)
TIBC: 339 ug/dL (ref 250–470)
UIBC: 270 ug/dL (ref 125–400)

## 2015-06-03 LAB — INSULIN, RANDOM: Insulin: 2.7 u[IU]/mL (ref 2.0–19.6)

## 2015-06-03 LAB — VITAMIN D 25 HYDROXY (VIT D DEFICIENCY, FRACTURES): Vit D, 25-Hydroxy: 28 ng/mL — ABNORMAL LOW (ref 30–100)

## 2015-06-03 LAB — VITAMIN B12: Vitamin B-12: 287 pg/mL (ref 211–911)

## 2015-06-05 LAB — TB SKIN TEST
Induration: 0 mm
TB SKIN TEST: NEGATIVE

## 2015-06-30 ENCOUNTER — Ambulatory Visit (INDEPENDENT_AMBULATORY_CARE_PROVIDER_SITE_OTHER): Payer: Managed Care, Other (non HMO) | Admitting: Internal Medicine

## 2015-06-30 ENCOUNTER — Encounter: Payer: Self-pay | Admitting: Internal Medicine

## 2015-06-30 VITALS — BP 120/74 | HR 76 | Temp 97.3°F | Resp 16 | Ht 68.0 in | Wt 182.8 lb

## 2015-06-30 DIAGNOSIS — R5383 Other fatigue: Secondary | ICD-10-CM | POA: Diagnosis not present

## 2015-06-30 DIAGNOSIS — D485 Neoplasm of uncertain behavior of skin: Secondary | ICD-10-CM | POA: Diagnosis not present

## 2015-06-30 DIAGNOSIS — R7309 Other abnormal glucose: Secondary | ICD-10-CM

## 2015-06-30 DIAGNOSIS — E559 Vitamin D deficiency, unspecified: Secondary | ICD-10-CM | POA: Insufficient documentation

## 2015-06-30 DIAGNOSIS — Z6827 Body mass index (BMI) 27.0-27.9, adult: Secondary | ICD-10-CM

## 2015-06-30 NOTE — Progress Notes (Signed)
   Subjective:    Patient ID: Stacy Vaughan, female    DOB: 27-Mar-1991, 24 y.o.   MRN: [id number]  HPI  This very nice 24 yo SWF recently post part ha d a recent w/u for fatigue which was negative other than a very low Vit D level of 28 for which she has started taking 10,000 units daily. She does admit her fatigue is most likely related to her disrupted sleep schedule with her now 56 mo old newborn girl  and also caring for her other 24 yo daughter. 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  Other problems is concern about 2 skin lesions in her left axill and also her left lower back.   Meds - Vit D 10,000   All - None  Past Medical History  Diagnosis Date  . ADD (attention deficit disorder)   . Scoliosis   . MRSA (methicillin resistant staph aureus) culture positive 4 YEARS AGO   Review of Systems  10 point systems review negative except as above.    Objective:   Physical Exam BP 120/74 mmHg  Pulse 76  Temp(Src) 97.3 F (36.3 C)  Resp 16  Ht 5\' 8"  (1.727 m)  Wt 182 lb 12.8 oz (82.918 kg)  BMI 27.80 kg/m2  HEENT - Eac's patent. TM's Nl. EOM's full. PERRLA. NasoOroPharynx clear. Neck - supple. Nl Thyroid. Carotids 2+ & No bruits, nodes, JVD Chest - Clear equal BS w/o Rales, rhonchi, wheezes. Cor - Nl HS. RRR w/o sig MGR. PP 1(+). No edema. Abd - No palpable organomegaly, masses or tenderness. BS nl. MS- FROM w/o deformities. Muscle power, tone and bulk Nl. Gait Nl. Neuro - No obvious Cr N abnormalities. Sensory, motor and Cerebellar functions appear Nl w/o focal abnormalities. Psyche - Mental status normal & appropriate.  No delusions, ideations or obvious mood abnormalities.  Skin - (1) Procedure:11300 - There is a 3 mm x 3 mm dark/medium & light brown flat lesion of the left lower back. After informed consent and local anesthesia with 0.3 ml lidocaine 1% sq, the lesion was sharply excised with a #11 scalpel with a 2 mm margin to the sq layer  and then deeply hyfrecated for hemostasis & electro destruction of any residual lesion fragments. Abx ung was applied followed by a 2 " x 3 " Tegaderm occlusive dsg.    (2)  Procedure:11300 - There is a 4 mm x 4 mm medium & light brown raised  lesion of the leftaxilla. After informed consent and local anesthesia with 0.3 ml lidocaine 1% sq, the lesion was excised by shave technique with a #11 scalpel  and then deeply hyfrecated for hemostasis & electro destruction of any residual lesion fragments. Abx ung was applied followed by a 2" x 3" Tegaderm occlusive dsg.  Patient was instructed in post -procedure wound care.    Assessment & Plan:   1. Fatigue - multifactorial - post-partum   2. Vitamin D deficiency   3. Neoplasm of uncertain behavior of skin  - Procedure 11300 - excise lesion left lower back -lesion to Dermatology pathology  - Procedure 11300 - excise lesion left axillae - lesion to Dermatology pathology   4. BMI 27.0-27.9,adult

## 2015-08-11 ENCOUNTER — Encounter: Payer: Self-pay | Admitting: Internal Medicine

## 2015-08-11 ENCOUNTER — Ambulatory Visit (INDEPENDENT_AMBULATORY_CARE_PROVIDER_SITE_OTHER): Payer: Managed Care, Other (non HMO) | Admitting: Internal Medicine

## 2015-08-11 VITALS — BP 112/72 | HR 76 | Temp 97.4°F | Resp 16 | Ht 68.0 in | Wt 189.2 lb

## 2015-08-11 DIAGNOSIS — D485 Neoplasm of uncertain behavior of skin: Secondary | ICD-10-CM | POA: Diagnosis not present

## 2015-08-11 NOTE — Progress Notes (Signed)
   Subjective:    Patient ID: Stacy Vaughan, female    DOB: [birth year], 24 y.o.   MRN: [id number]  HPI  Patient returns for re-excision of an Atypical junctional Lentiginous Nevus of her left Lower Back with one lateral margin involved  ( [id number]).  Review of Systems 10 point systems review negative except as above.    Objective:   Physical Exam  BP 112/72 mmHg  Pulse 76  Temp(Src) 97.4 F (36.3 C)  Resp 16  Ht 5\' 8"  (1.727 m)  Wt 189 lb 3.2 oz (85.821 kg)  BMI 28.77 kg/m2  Procedure(CPT: 11400) - Previous bx site at left lower back was identified and prepped with alcohol and the locally anesthetized with Marcaine 0.5% with epi. Then with a #10 scalpel the bx site was excised in elliptical fashion full thickness to the SQ. Wound edges undermined and then approximated with # 3 running lock sutures with 4-0 Proline. Lesion was sent for path analysis.  Antibiotic ung applied and then the wound was covered with a 4' x 6" Tegaderm.   Patient instructed in wound care.     Assessment & Plan:    1. Neoplasm of uncertain behavior of skin  - Dermatology pathology  -  ROV-  1 week for suture removal

## 2015-08-18 ENCOUNTER — Encounter: Payer: Self-pay | Admitting: Internal Medicine

## 2015-08-18 ENCOUNTER — Ambulatory Visit: Payer: Self-pay | Admitting: Internal Medicine

## 2015-08-18 DIAGNOSIS — D239 Other benign neoplasm of skin, unspecified: Secondary | ICD-10-CM

## 2015-08-18 NOTE — Progress Notes (Signed)
Patient ID: Stacy Vaughan, female   DOB: [birth year], 24 y.o.   MRN: [id number]  Patient returned for suture removal of wider bx of dysplastic nevus WITH A (+) MARGIN  with neg repeat path. Her back wound is well healed w/o signs of infection and good healing ridge. Patient advised to return for 2sd re-excision of her left axillary bx that also had a (+) margin.

## 2016-06-07 DIAGNOSIS — IMO0001 Reserved for inherently not codable concepts without codable children: Secondary | ICD-10-CM | POA: Insufficient documentation

## 2016-06-07 DIAGNOSIS — Z79899 Other long term (current) drug therapy: Secondary | ICD-10-CM | POA: Insufficient documentation

## 2016-06-07 DIAGNOSIS — E7800 Pure hypercholesterolemia, unspecified: Secondary | ICD-10-CM | POA: Insufficient documentation

## 2016-06-07 DIAGNOSIS — Z0001 Encounter for general adult medical examination with abnormal findings: Secondary | ICD-10-CM | POA: Insufficient documentation

## 2016-06-07 DIAGNOSIS — R030 Elevated blood-pressure reading, without diagnosis of hypertension: Secondary | ICD-10-CM

## 2016-06-07 DIAGNOSIS — R7309 Other abnormal glucose: Secondary | ICD-10-CM | POA: Insufficient documentation

## 2016-06-07 NOTE — Patient Instructions (Signed)
Preventive Care for Adults  A healthy lifestyle and preventive care can promote health and wellness. Preventive health guidelines for women include the following key practices.  A routine yearly physical is a good way to check with your health care provider about your health and preventive screening. It is a chance to share any concerns and updates on your health and to receive a thorough exam.  Visit your dentist for a routine exam and preventive care every 6 months. Brush your teeth twice a day and floss once a day. Good oral hygiene prevents tooth decay and gum disease.  The frequency of eye exams is based on your age, health, family medical history, use of contact lenses, and other factors. Follow your health care provider's recommendations for frequency of eye exams.  Eat a healthy diet. Foods like vegetables, fruits, whole grains, low-fat dairy products, and lean protein foods contain the nutrients you need without too many calories. Decrease your intake of foods high in solid fats, added sugars, and salt. Eat the right amount of calories for you.Get information about a proper diet from your health care provider, if necessary.  Regular physical exercise is one of the most important things you can do for your health. Most adults should get at least 150 minutes of moderate-intensity exercise (any activity that increases your heart rate and causes you to sweat) each week. In addition, most adults need muscle-strengthening exercises on 2 or more days a week.  Maintain a healthy weight. The body mass index (BMI) is a screening tool to identify possible weight problems. It provides an estimate of body fat based on height and weight. Your health care provider can find your BMI and can help you achieve or maintain a healthy weight.For adults 20 years and older:  A BMI below 18.5 is considered underweight.  A BMI of 18.5 to 24.9 is normal.  A BMI of 25 to 29.9 is considered overweight.  A BMI of  30 and above is considered obese.  Maintain normal blood lipids and cholesterol levels by exercising and minimizing your intake of saturated fat. Eat a balanced diet with plenty of fruit and vegetables. Blood tests for lipids and cholesterol should begin at age 20 and be repeated every 5 years. If your lipid or cholesterol levels are high, you are over 50, or you are at high risk for heart disease, you may need your cholesterol levels checked more frequently.Ongoing high lipid and cholesterol levels should be treated with medicines if diet and exercise are not working.  If you smoke, find out from your health care provider how to quit. If you do not use tobacco, do not start.  Lung cancer screening is recommended for adults aged 55-80 years who are at high risk for developing lung cancer because of a history of smoking. A yearly low-dose CT scan of the lungs is recommended for people who have at least a 30-pack-year history of smoking and are a current smoker or have quit within the past 15 years. A pack year of smoking is smoking an average of 1 pack of cigarettes a day for 1 year (for example: 1 pack a day for 30 years or 2 packs a day for 15 years). Yearly screening should continue until the smoker has stopped smoking for at least 15 years. Yearly screening should be stopped for people who develop a health problem that would prevent them from having lung cancer treatment.  If you are pregnant, do not drink alcohol. If you are   breastfeeding, be very cautious about drinking alcohol. If you are not pregnant and choose to drink alcohol, do not have more than 1 drink per day. One drink is considered to be 12 ounces (355 mL) of beer, 5 ounces (148 mL) of wine, or 1.5 ounces (44 mL) of liquor.  Avoid use of street drugs. Do not share needles with anyone. Ask for help if you need support or instructions about stopping the use of drugs.  High blood pressure causes heart disease and increases the risk of  stroke. Your blood pressure should be checked at least every 1 to 2 years. Ongoing high blood pressure should be treated with medicines if weight loss and exercise do not work.  If you are 3-31 years old, ask your health care provider if you should take aspirin to prevent strokes.  Diabetes screening involves taking a blood sample to check your fasting blood sugar level. This should be done once every 3 years, after age 31, if you are within normal weight and without risk factors for diabetes. Testing should be considered at a younger age or be carried out more frequently if you are overweight and have at least 1 risk factor for diabetes.  Breast cancer screening is essential preventive care for women. You should practice "breast self-awareness." This means understanding the normal appearance and feel of your breasts and may include breast self-examination. Any changes detected, no matter how small, should be reported to a health care provider. Women in their 76s and 30s should have a clinical breast exam (CBE) by a health care provider as part of a regular health exam every 1 to 3 years. After age 65, women should have a CBE every year. Starting at age 67, women should consider having a mammogram (breast X-ray test) every year. Women who have a family history of breast cancer should talk to their health care provider about genetic screening. Women at a high risk of breast cancer should talk to their health care providers about having an MRI and a mammogram every year.  Breast cancer gene (BRCA)-related cancer risk assessment is recommended for women who have family members with BRCA-related cancers. BRCA-related cancers include breast, ovarian, tubal, and peritoneal cancers. Having family members with these cancers may be associated with an increased risk for harmful changes (mutations) in the breast cancer genes BRCA1 and BRCA2. Results of the assessment will determine the need for genetic counseling and  BRCA1 and BRCA2 testing.  Routine pelvic exams to screen for cancer are no longer recommended for nonpregnant women who are considered low risk for cancer of the pelvic organs (ovaries, uterus, and vagina) and who do not have symptoms. Ask your health care provider if a screening pelvic exam is right for you.  If you have had past treatment for cervical cancer or a condition that could lead to cancer, you need Pap tests and screening for cancer for at least 20 years after your treatment. If Pap tests have been discontinued, your risk factors (such as having a new sexual partner) need to be reassessed to determine if screening should be resumed. Some women have medical problems that increase the chance of getting cervical cancer. In these cases, your health care provider may recommend more frequent screening and Pap tests.  The HPV test is an additional test that may be used for cervical cancer screening. The HPV test looks for the virus that can cause the cell changes on the cervix. The cells collected during the Pap test can  be tested for HPV. The HPV test could be used to screen women aged 98 years and older, and should be used in women of any age who have unclear Pap test results. After the age of 59, women should have HPV testing at the same frequency as a Pap test.  Colorectal cancer can be detected and often prevented. Most routine colorectal cancer screening begins at the age of 35 years and continues through age 61 years. However, your health care provider may recommend screening at an earlier age if you have risk factors for colon cancer. On a yearly basis, your health care provider may provide home test kits to check for hidden blood in the stool. Use of a small camera at the end of a tube, to directly examine the colon (sigmoidoscopy or colonoscopy), can detect the earliest forms of colorectal cancer. Talk to your health care provider about this at age 18, when routine screening begins. Direct  exam of the colon should be repeated every 5-10 years through age 67 years, unless early forms of pre-cancerous polyps or small growths are found.  People who are at an increased risk for hepatitis B should be screened for this virus. You are considered at high risk for hepatitis B if:  You were born in a country where hepatitis B occurs often. Talk with your health care provider about which countries are considered high risk.  Your parents were born in a high-risk country and you have not received a shot to protect against hepatitis B (hepatitis B vaccine).  You have HIV or AIDS.  You use needles to inject street drugs.  You live with, or have sex with, someone who has hepatitis B.  You get hemodialysis treatment.  You take certain medicines for conditions like cancer, organ transplantation, and autoimmune conditions.  Hepatitis C blood testing is recommended for all people born from [birth year] through 1965 and any individual with known risks for hepatitis C.  Practice safe sex. Use condoms and avoid high-risk sexual practices to reduce the spread of sexually transmitted infections (STIs). STIs include gonorrhea, chlamydia, syphilis, trichomonas, herpes, HPV, and human immunodeficiency virus (HIV). Herpes, HIV, and HPV are viral illnesses that have no cure. They can result in disability, cancer, and death.  You should be screened for sexually transmitted illnesses (STIs) including gonorrhea and chlamydia if:  You are sexually active and are younger than 24 years.  You are older than 24 years and your health care provider tells you that you are at risk for this type of infection.  Your sexual activity has changed since you were last screened and you are at an increased risk for chlamydia or gonorrhea. Ask your health care provider if you are at risk.  If you are at risk of being infected with HIV, it is recommended that you take a prescription medicine daily to prevent HIV infection. This is  called preexposure prophylaxis (PrEP). You are considered at risk if:  You are a heterosexual woman, are sexually active, and are at increased risk for HIV infection.  You take drugs by injection.  You are sexually active with a partner who has HIV.  Talk with your health care provider about whether you are at high risk of being infected with HIV. If you choose to begin PrEP, you should first be tested for HIV. You should then be tested every 3 months for as long as you are taking PrEP.  Osteoporosis is a disease in which the bones lose minerals and  strength with aging. This can result in serious bone fractures or breaks. The risk of osteoporosis can be identified using a bone density scan. Women ages 48 years and over and women at risk for fractures or osteoporosis should discuss screening with their health care providers. Ask your health care provider whether you should take a calcium supplement or vitamin D to reduce the rate of osteoporosis.  Menopause can be associated with physical symptoms and risks. Hormone replacement therapy is available to decrease symptoms and risks. You should talk to your health care provider about whether hormone replacement therapy is right for you.  Use sunscreen. Apply sunscreen liberally and repeatedly throughout the day. You should seek shade when your shadow is shorter than you. Protect yourself by wearing long sleeves, pants, a wide-brimmed hat, and sunglasses year round, whenever you are outdoors.  Once a month, do a whole body skin exam, using a mirror to look at the skin on your back. Tell your health care provider of new moles, moles that have irregular borders, moles that are larger than a pencil eraser, or moles that have changed in shape or color.  Stay current with required vaccines (immunizations).  Influenza vaccine. All adults should be immunized every year.  Tetanus, diphtheria, and acellular pertussis (Td, Tdap) vaccine. Pregnant women should  receive 1 dose of Tdap vaccine during each pregnancy. The dose should be obtained regardless of the length of time since the last dose. Immunization is preferred during the 27th-36th week of gestation. An adult who has not previously received Tdap or who does not know her vaccine status should receive 1 dose of Tdap. This initial dose should be followed by tetanus and diphtheria toxoids (Td) booster doses every 10 years. Adults with an unknown or incomplete history of completing a 3-dose immunization series with Td-containing vaccines should begin or complete a primary immunization series including a Tdap dose. Adults should receive a Td booster every 10 years.  Varicella vaccine. An adult without evidence of immunity to varicella should receive 2 doses or a second dose if she has previously received 1 dose. Pregnant females who do not have evidence of immunity should receive the first dose after pregnancy. This first dose should be obtained before leaving the health care facility. The second dose should be obtained 4-8 weeks after the first dose.  Human papillomavirus (HPV) vaccine. Females aged 13-26 years who have not received the vaccine previously should obtain the 3-dose series. The vaccine is not recommended for use in pregnant females. However, pregnancy testing is not needed before receiving a dose. If a female is found to be pregnant after receiving a dose, no treatment is needed. In that case, the remaining doses should be delayed until after the pregnancy. Immunization is recommended for any person with an immunocompromised condition through the age of 48 years if she did not get any or all doses earlier. During the 3-dose series, the second dose should be obtained 4-8 weeks after the first dose. The third dose should be obtained 24 weeks after the first dose and 16 weeks after the second dose.  Zoster vaccine. One dose is recommended for adults aged 67 years or older unless certain conditions are  present.  Measles, mumps, and rubella (MMR) vaccine. Adults born before [birth year] generally are considered immune to measles and mumps. Adults born in [birth year] or later should have 1 or more doses of MMR vaccine unless there is a contraindication to the vaccine or there is laboratory evidence of immunity  to each of the three diseases. A routine second dose of MMR vaccine should be obtained at least 28 days after the first dose for students attending postsecondary schools, health care workers, or international travelers. People who received inactivated measles vaccine or an unknown type of measles vaccine during 1963-1967 should receive 2 doses of MMR vaccine. People who received inactivated mumps vaccine or an unknown type of mumps vaccine before 1979 and are at high risk for mumps infection should consider immunization with 2 doses of MMR vaccine. For females of childbearing age, rubella immunity should be determined. If there is no evidence of immunity, females who are not pregnant should be vaccinated. If there is no evidence of immunity, females who are pregnant should delay immunization until after pregnancy. Unvaccinated health care workers born before [birth year] who lack laboratory evidence of measles, mumps, or rubella immunity or laboratory confirmation of disease should consider measles and mumps immunization with 2 doses of MMR vaccine or rubella immunization with 1 dose of MMR vaccine.  Pneumococcal 13-valent conjugate (PCV13) vaccine. When indicated, a person who is uncertain of her immunization history and has no record of immunization should receive the PCV13 vaccine. An adult aged 58 years or older who has certain medical conditions and has not been previously immunized should receive 1 dose of PCV13 vaccine. This PCV13 should be followed with a dose of pneumococcal polysaccharide (PPSV23) vaccine. The PPSV23 vaccine dose should be obtained at least 8 weeks after the dose of PCV13 vaccine. An adult aged 65  years or older who has certain medical conditions and previously received 1 or more doses of PPSV23 vaccine should receive 1 dose of PCV13. The PCV13 vaccine dose should be obtained 1 or more years after the last PPSV23 vaccine dose.  Pneumococcal polysaccharide (PPSV23) vaccine. When PCV13 is also indicated, PCV13 should be obtained first. All adults aged 18 years and older should be immunized. An adult younger than age 74 years who has certain medical conditions should be immunized. Any person who resides in a nursing home or long-term care facility should be immunized. An adult smoker should be immunized. People with an immunocompromised condition and certain other conditions should receive both PCV13 and PPSV23 vaccines. People with human immunodeficiency virus (HIV) infection should be immunized as soon as possible after diagnosis. Immunization during chemotherapy or radiation therapy should be avoided. Routine use of PPSV23 vaccine is not recommended for American Indians, [place] Natives, or people younger than 65 years unless there are medical conditions that require PPSV23 vaccine. When indicated, people who have unknown immunization and have no record of immunization should receive PPSV23 vaccine. One-time revaccination 5 years after the first dose of PPSV23 is recommended for people aged 19-64 years who have chronic kidney failure, nephrotic syndrome, asplenia, or immunocompromised conditions. People who received 1-2 doses of PPSV23 before age 21 years should receive another dose of PPSV23 vaccine at age 42 years or later if at least 5 years have passed since the previous dose. Doses of PPSV23 are not needed for people immunized with PPSV23 at or after age 61 years.  Meningococcal vaccine. Adults with asplenia or persistent complement component deficiencies should receive 2 doses of quadrivalent meningococcal conjugate (MenACWY-D) vaccine. The doses should be obtained at least 2 months apart.  Microbiologists working with certain meningococcal bacteria, [employer] recruits, people at risk during an outbreak, and people who travel to or live in countries with a high rate of meningitis should be immunized. A first-year college student up through  age 23 years who is living in a residence hall should receive a dose if she did not receive a dose on or after her 16th birthday. Adults who have certain high-risk conditions should receive one or more doses of vaccine.  Hepatitis A vaccine. Adults who wish to be protected from this disease, have certain high-risk conditions, work with hepatitis A-infected animals, work in hepatitis A research labs, or travel to or work in countries with a high rate of hepatitis A should be immunized. Adults who were previously unvaccinated and who anticipate close contact with an international adoptee during the first 60 days after arrival in the [place] from a country with a high rate of hepatitis A should be immunized.  Hepatitis B vaccine. Adults who wish to be protected from this disease, have certain high-risk conditions, may be exposed to blood or other infectious body fluids, are household contacts or sex partners of hepatitis B positive people, are clients or workers in certain care facilities, or travel to or work in countries with a high rate of hepatitis B should be immunized.  Haemophilus influenzae type b (Hib) vaccine. A previously unvaccinated person with asplenia or sickle cell disease or having a scheduled splenectomy should receive 1 dose of Hib vaccine. Regardless of previous immunization, a recipient of a hematopoietic stem cell transplant should receive a 3-dose series 6-12 months after her successful transplant. Hib vaccine is not recommended for adults with HIV infection. Preventive Services / Frequency  Ages 65 to 22 years  Blood pressure check.  Lipid and cholesterol check.  Clinical breast exam.** / Every 3 years for women in their 53s  and 35s.  BRCA-related cancer risk assessment.** / For women who have family members with a BRCA-related cancer (breast, ovarian, tubal, or peritoneal cancers).  Pap test.** / Every 2 years from ages 41 through 44. Every 3 years starting at age 64 through age 28 or 21 with a history of 3 consecutive normal Pap tests.  HPV screening.** / Every 3 years from ages 9 through ages 56 to 48 with a history of 3 consecutive normal Pap tests.  Hepatitis C blood test.** / For any individual with known risks for hepatitis C.  Skin self-exam. / Monthly.  Influenza vaccine. / Every year.  Tetanus, diphtheria, and acellular pertussis (Tdap, Td) vaccine.** / Consult your health care provider. Pregnant women should receive 1 dose of Tdap vaccine during each pregnancy. 1 dose of Td every 10 years.  Varicella vaccine.** / Consult your health care provider. Pregnant females who do not have evidence of immunity should receive the first dose after pregnancy.  HPV vaccine. / 3 doses over 6 months, if 67 and younger. The vaccine is not recommended for use in pregnant females. However, pregnancy testing is not needed before receiving a dose.  Measles, mumps, rubella (MMR) vaccine.** / You need at least 1 dose of MMR if you were born in 1957 or later. You may also need a 2nd dose. For females of childbearing age, rubella immunity should be determined. If there is no evidence of immunity, females who are not pregnant should be vaccinated. If there is no evidence of immunity, females who are pregnant should delay immunization until after pregnancy.  Pneumococcal 13-valent conjugate (PCV13) vaccine.** / Consult your health care provider.  Pneumococcal polysaccharide (PPSV23) vaccine.** / 1 to 2 doses if you smoke cigarettes or if you have certain conditions.  Meningococcal vaccine.** / 1 dose if you are age 61 to 21 years and  a first-year college student living in a residence hall, or have one of several medical  conditions, you need to get vaccinated against meningococcal disease. You may also need additional booster doses.  Hepatitis A vaccine.** / Consult your health care provider.  Hepatitis B vaccine.** / Consult your health care provider.  Haemophilus influenzae type b (Hib) vaccine.** / Consult your health care provider. ++++++++ Recommend Adult Low Dose Aspirin or  coated  Aspirin 81 mg daily  To reduce risk of Colon Cancer 20 %,  Skin Cancer 26 % ,  Melanoma 46%  and  Pancreatic cancer 60% ++++++++++++++++++ Vitamin D goal  is between 70-100.  Please make sure that you are taking your Vitamin D as directed.  It is very important as a natural anti-inflammatory  helping hair, skin, and nails, as well as reducing stroke and heart attack risk.  It helps your bones and helps with mood. It also decreases numerous cancer risks so please take it as directed.  Low Vit D is associated with a 200-300% higher risk for CANCER  and 200-300% higher risk for HEART   ATTACK  &  STROKE.   ...................................... It is also associated with higher death rate at younger ages,  autoimmune diseases like Rheumatoid arthritis, Lupus, Multiple Sclerosis.    Also many other serious conditions, like depression, Alzheimer's Dementia, infertility, muscle aches, fatigue, fibromyalgia - just to name a few. ++++++++++++++++++++ Recommend the book "The END of DIETING" by Dr Joel Fuhrman  & the book "The END of DIABETES " by Dr Joel Fuhrman At [employer] - get book & Audio CD's    Being diabetic has a  300% increased risk for heart attack, stroke, cancer, and alzheimer- type vascular dementia. It is very important that you work harder with diet by avoiding all foods that are white. Avoid white rice (brown & wild rice is OK), white potatoes (sweetpotatoes in moderation is OK), White bread or wheat bread or anything made out of white flour like bagels, donuts, rolls, buns, biscuits, cakes, pastries,  cookies, pizza crust, and pasta (made from white flour & egg whites) - vegetarian pasta or spinach or wheat pasta is OK. Multigrain breads like Arnold's or [employer], or multigrain sandwich thins or flatbreads.  Diet, exercise and weight loss can reverse and cure diabetes in the early stages.  Diet, exercise and weight loss is very important in the control and prevention of complications of diabetes which affects every system in your body, ie. Brain - dementia/stroke, eyes - glaucoma/blindness, heart - heart attack/heart failure, kidneys - dialysis, stomach - gastric paralysis, intestines - malabsorption, nerves - severe painful neuritis, circulation - gangrene & loss of a leg(s), and finally cancer and Alzheimers.    I recommend avoid fried & greasy foods,  sweets/candy, white rice (brown or wild rice or Quinoa is OK), white potatoes (sweet potatoes are OK) - anything made from white flour - bagels, doughnuts, rolls, buns, biscuits,white and wheat breads, pizza crust and traditional pasta made of white flour & egg white(vegetarian pasta or spinach or wheat pasta is OK).  Multi-grain bread is OK - like multi-grain flat bread or sandwich thins. Avoid alcohol in excess. Exercise is also important.    Eat all the vegetables you want - avoid meat, especially red meat and dairy - especially cheese.  Cheese is the most concentrated form of trans-fats which is the worst thing to clog up our arteries. Veggie cheese is OK which can be found in the fresh produce   section at [employer] or [employer] or [employer]  ++++++++++++++++++++ DASH Eating Plan  DASH stands for "Dietary Approaches to Stop Hypertension."   The DASH eating plan is a healthy eating plan that has been shown to reduce high blood pressure (hypertension). Additional health benefits may include reducing the risk of type 2 diabetes mellitus, heart disease, and stroke. The DASH eating plan may also help with weight loss. WHAT DO I NEED TO KNOW  ABOUT THE DASH EATING PLAN? For the DASH eating plan, you will follow these general guidelines:  Choose foods with a percent daily value for sodium of less than 5% (as listed on the food label).  Use salt-free seasonings or herbs instead of table salt or sea salt.  Check with your health care provider or pharmacist before using salt substitutes.  Eat lower-sodium products, often labeled as "lower sodium" or "no salt added."  Eat fresh foods.  Eat more vegetables, fruits, and low-fat dairy products.  Choose whole grains. Look for the word "whole" as the first word in the ingredient list.  Choose fish   Limit sweets, desserts, sugars, and sugary drinks.  Choose heart-healthy fats.  Eat veggie cheese   Eat more home-cooked food and less restaurant, buffet, and fast food.  Limit fried foods.  Cook foods using methods other than frying.  Limit canned vegetables. If you do use them, rinse them well to decrease the sodium.  When eating at a restaurant, ask that your food be prepared with less salt, or no salt if possible.                      WHAT FOODS CAN I EAT? Read Dr Joel Fuhrman's books on The End of Dieting & The End of Diabetes  Grains Whole grain or whole wheat bread. Brown rice. Whole grain or whole wheat pasta. Quinoa, bulgur, and whole grain cereals. Low-sodium cereals. Corn or whole wheat flour tortillas. Whole grain cornbread. Whole grain crackers. Low-sodium crackers.  Vegetables Fresh or frozen vegetables (raw, steamed, roasted, or grilled). Low-sodium or reduced-sodium tomato and vegetable juices. Low-sodium or reduced-sodium tomato sauce and paste. Low-sodium or reduced-sodium canned vegetables.   Fruits All fresh, canned (in natural juice), or frozen fruits.  Protein Products  All fish and seafood.  Dried beans, peas, or lentils. Unsalted nuts and seeds. Unsalted canned beans.  Dairy Low-fat dairy products, such as skim or 1% milk, 2% or reduced-fat  cheeses, low-fat ricotta or cottage cheese, or plain low-fat yogurt. Low-sodium or reduced-sodium cheeses.  Fats and Oils Tub margarines without trans fats. Light or reduced-fat mayonnaise and salad dressings (reduced sodium). Avocado. Safflower, olive, or canola oils. Natural peanut or almond butter.  Other Unsalted popcorn and pretzels. The items listed above may not be a complete list of recommended foods or beverages. Contact your dietitian for more options.  +++++++++++++++++++  WHAT FOODS ARE NOT RECOMMENDED? Grains/ White flour or wheat flour White bread. White pasta. White rice. Refined cornbread. Bagels and croissants. Crackers that contain trans fat.  Vegetables  Creamed or fried vegetables. Vegetables in a . Regular canned vegetables. Regular canned tomato sauce and paste. Regular tomato and vegetable juices.  Fruits Dried fruits. Canned fruit in light or heavy syrup. Fruit juice.  Meat and Other Protein Products Meat in general - RED meat & White meat.  Fatty cuts of meat. Ribs, chicken wings, all processed meats as bacon, sausage, bologna, salami, fatback, hot dogs, bratwurst and packaged luncheon meats.  Dairy Whole   or 2% milk, cream, half-and-half, and cream cheese. Whole-fat or sweetened yogurt. Full-fat cheeses or blue cheese. Non-dairy creamers and whipped toppings. Processed cheese, cheese spreads, or cheese curds.  Condiments Onion and garlic salt, seasoned salt, table salt, and sea salt. Canned and packaged gravies. Worcestershire sauce. Tartar sauce. Barbecue sauce. Teriyaki sauce. Soy sauce, including reduced sodium. Steak sauce. Fish sauce. Oyster sauce. Cocktail sauce. Horseradish. Ketchup and mustard. Meat flavorings and tenderizers. Bouillon cubes. Hot sauce. Tabasco sauce. Marinades. Taco seasonings. Relishes.  Fats and Oils Butter, stick margarine, lard, shortening and bacon fat. Coconut, palm kernel, or palm oils. Regular salad dressings.  Pickles and  olives. Salted popcorn and pretzels.  The items listed above may not be a complete list of foods and beverages to avoid.    

## 2016-06-07 NOTE — Progress Notes (Signed)
[facility]                   Unk Pinto, M.D.    Uvaldo Bristle. Silverio Lay, P.A.-C      Starlyn Skeans, P.A.-C   [facility]                [address] [place] [id number] Telephone [phone] Telefax [phone]   Annual Screening/Preventative Visit And Comprehensive Evaluation &  Examination     This very nice 26 y.o.single  WF presents for a Wellness/Preventative Visit & comprehensive evaluation and management and is notably 1 year post partum (has a 25 yo & 20 yo daughter). Patient presents now for screening for elevated BP, Lipids, Abnormal Glucose and Vit D deficiency. Patient has been treated for ADD in the past.        Patient's BP has been normal and patient denies any cardiac symptoms as chest pain, palpitations, shortness of breath, dizziness or ankle swelling. Today's BP: 120/74      Patient's lipids have been normal is controlled with prudent diet. Last lipids were at goal: Lab Results  Component Value Date   CHOL 165 05/30/2014   HDL 72 05/30/2014   LDLCALC 72 05/30/2014   TRIG 103 05/30/2014   CHOLHDL 2.3 05/30/2014      Patient has been screened proactively for prediabetes and patient denies reactive hypoglycemic symptoms, visual blurring, diabetic polys, or paresthesias. Last A1c was at goal: Lab Results  Component Value Date   HGBA1C 5.6 06/02/2015      Finally, patient has history of Vitamin D Deficiency and last Vitamin D was still very low:  Lab Results  Component Value Date   VD25OH 28 (L) 06/02/2015   No Known Allergies   Past Medical History:  Diagnosis Date  . ADD (attention deficit disorder)   . MRSA (methicillin resistant staph aureus) culture positive 4 YEARS AGO  . Scoliosis    Health Maintenance  Topic Date Due  . TETANUS/TDAP  03/21/2010  . PAP SMEAR  03/21/2012  . INFLUENZA VACCINE  06/01/2016  . HIV Screening  Completed   Immunization  History  Administered Date(s) Administered  . DTaP 11/01/2013  . PPD Test 05/30/2014, 06/02/2015, 06/08/2016  . Td 06/01/2006   Past Surgical History:  Procedure Laterality Date  . CHOLECYSTECTOMY  2003  . WISDOM TOOTH EXTRACTION  2008   Family History  Problem Relation Age of Onset  . Migraines Mother   . Hyperlipidemia Father    Social History  Substance Use Topics  . Smoking status: Former Research scientist (life sciences)  . Smokeless tobacco: Not on file     Comment: Patient quit due to pregnancy and does not intend to restart.  . Alcohol use No     Comment: occasionally    ROS Constitutional: Denies fever, chills, weight loss/gain, headaches, insomnia,  night sweats, and change in appetite. Does c/o fatigue. Eyes: Denies redness, blurred vision, diplopia, discharge, itchy, watery eyes.  ENT: Denies discharge, congestion, post nasal drip, epistaxis, sore throat, earache, hearing loss, dental pain, Tinnitus, Vertigo, Sinus pain, snoring.  Cardio: Denies chest pain, palpitations, irregular heartbeat, syncope, dyspnea, diaphoresis, orthopnea, PND, claudication, edema Respiratory: denies cough, dyspnea, DOE, pleurisy, hoarseness, laryngitis, wheezing.  Gastrointestinal: Denies dysphagia, heartburn, reflux, water brash, pain, cramps, nausea, vomiting, bloating, diarrhea, constipation, hematemesis, melena, hematochezia, jaundice, hemorrhoids Genitourinary: Denies dysuria, frequency, urgency,  nocturia, hesitancy, discharge, hematuria, flank pain Breast: No Breast lumps, nipple discharge, bleeding.  Musculoskeletal: Denies arthralgia, myalgia, stiffness, Jt. Swelling, pain, limp, and strain/sprain. Denies falls. Skin: Denies puritis, rash, hives, warts, acne, eczema, changing in skin lesion Neuro: No weakness, tremor, incoordination, spasms, paresthesia, pain Psychiatric: Denies confusion, memory loss, sensory loss. Denies Depression. Endocrine: Denies change in weight, skin, hair change, nocturia, and  paresthesia, diabetic polys, visual blurring, hyper / hypo glycemic episodes.  Heme/Lymph: No excessive bleeding, bruising, enlarged lymph nodes.  Physical Exam  BP 120/74   Pulse 80   Temp 97.5 F (36.4 C)   Resp 16   Ht 5\' 8"  (1.727 m)   Wt 169 lb 3.2 oz (76.7 kg)   BMI 25.73 kg/m   General Appearance: Well nourished and in no apparent distress. Eyes: PERRLA, EOMs, conjunctiva no swelling or erythema, normal fundi and vessels. Sinuses: No frontal/maxillary tenderness ENT/Mouth: EACs patent / TMs  nl. Nares clear without erythema, swelling, mucoid exudates. Oral hygiene is good. No erythema, swelling, or exudate. Tongue normal, non-obstructing. Tonsils not swollen or erythematous. Hearing normal.  Neck: Supple, thyroid full 1+ soft w/o tenderness or nodules. No bruits, nodes or JVD. Respiratory: Respiratory effort normal.  BS equal and clear bilateral without rales, rhonci, wheezing or stridor. Cardio: Heart sounds are normal with regular rate and rhythm and no murmurs, rubs or gallops. Peripheral pulses are normal and equal bilaterally without edema. No aortic or femoral bruits. Chest: symmetric with normal excursions and percussion. Breasts: Deferred to GYN (Dr Garwin Brothers)  Abdomen: Flat, soft with bowel sounds active. Nontender, no guarding, rebound, hernias, masses, or organomegaly.  Lymphatics: Non tender without lymphadenopathy.  Musculoskeletal: Full ROM all peripheral extremities, joint stability, 5/5 strength, and normal gait. Skin: Warm and dry without rashes, lesions, cyanosis, clubbing or  ecchymosis.  Neuro: Cranial nerves intact, reflexes equal bilaterally. Normal muscle tone, no cerebellar symptoms. Sensation intact.  Pysch: Alert and oriented X 3, normal affect, Insight and Judgment appropriate.   Assessment and Plan  1. Annual Preventative Screening Examination   2. Elevated BP, screening  - TSH  3. Elevated cholesterol, screening  - Lipid panel - TSH  4.  Other abnormal glucose, screning  - Hemoglobin A1c - Insulin, random  5. Vitamin D deficiency, hx/o  - VITAMIN D 25 Hydroxy  6. ADD (attention deficit disorder)   7. Other fatigue  - Vitamin B12 - Iron and TIBC - CBC with Differential/Platelet - TSH  8. Colon cancer screening  - POC Hemoccult Bld/Stl   9. Medication management  - Urinalysis, Routine w reflex microscopic - CBC with Differential/Platelet - BASIC METABOLIC PANEL WITH GFR - Hepatic function panel - Magnesium  10. Screening examination for pulmonary tuberculosis  - PPD  11. Thyromegaly, physiologic   - due to due to parenteral OC's - TSH for screening   Continue prudent diet as discussed, weight control, BP monitoring, regular exercise, and medications. Discussed med's effects and SE's. Screening labs and tests as requested with regular follow-up as recommended. Over 40 minutes of exam, counseling, chart review and high complex critical decision making was performed.

## 2016-06-08 ENCOUNTER — Encounter: Payer: Self-pay | Admitting: Internal Medicine

## 2016-06-08 ENCOUNTER — Ambulatory Visit (INDEPENDENT_AMBULATORY_CARE_PROVIDER_SITE_OTHER): Payer: Managed Care, Other (non HMO) | Admitting: Internal Medicine

## 2016-06-08 VITALS — BP 120/74 | HR 80 | Temp 97.5°F | Resp 16 | Ht 68.0 in | Wt 169.2 lb

## 2016-06-08 DIAGNOSIS — Z0000 Encounter for general adult medical examination without abnormal findings: Secondary | ICD-10-CM | POA: Diagnosis not present

## 2016-06-08 DIAGNOSIS — IMO0001 Reserved for inherently not codable concepts without codable children: Secondary | ICD-10-CM

## 2016-06-08 DIAGNOSIS — Z0001 Encounter for general adult medical examination with abnormal findings: Secondary | ICD-10-CM

## 2016-06-08 DIAGNOSIS — Z79899 Other long term (current) drug therapy: Secondary | ICD-10-CM | POA: Diagnosis not present

## 2016-06-08 DIAGNOSIS — R030 Elevated blood-pressure reading, without diagnosis of hypertension: Secondary | ICD-10-CM

## 2016-06-08 DIAGNOSIS — Z111 Encounter for screening for respiratory tuberculosis: Secondary | ICD-10-CM | POA: Diagnosis not present

## 2016-06-08 DIAGNOSIS — R5383 Other fatigue: Secondary | ICD-10-CM

## 2016-06-08 DIAGNOSIS — Z1211 Encounter for screening for malignant neoplasm of colon: Secondary | ICD-10-CM

## 2016-06-08 DIAGNOSIS — E7800 Pure hypercholesterolemia, unspecified: Secondary | ICD-10-CM

## 2016-06-08 DIAGNOSIS — E559 Vitamin D deficiency, unspecified: Secondary | ICD-10-CM

## 2016-06-08 DIAGNOSIS — F988 Other specified behavioral and emotional disorders with onset usually occurring in childhood and adolescence: Secondary | ICD-10-CM

## 2016-06-08 DIAGNOSIS — R7309 Other abnormal glucose: Secondary | ICD-10-CM

## 2016-06-08 LAB — IRON AND TIBC
%SAT: 12 % (ref 11–50)
IRON: 41 ug/dL (ref 40–190)
TIBC: 332 ug/dL (ref 250–450)
UIBC: 291 ug/dL (ref 125–400)

## 2016-06-08 LAB — BASIC METABOLIC PANEL WITH GFR
BUN: 9 mg/dL (ref 7–25)
CHLORIDE: 109 mmol/L (ref 98–110)
CO2: 21 mmol/L (ref 20–31)
Calcium: 9 mg/dL (ref 8.6–10.2)
Creat: 1 mg/dL (ref 0.50–1.10)
GFR, Est African American: >89 mL/min (ref >=60)
GFR, Est Non African American: 78 mL/min (ref >=60)
Glucose, Bld: 88 mg/dL (ref 65–99)
Potassium: 4.2 mmol/L (ref 3.5–5.3)
SODIUM: 140 mmol/L (ref 135–146)

## 2016-06-08 LAB — HEPATIC FUNCTION PANEL
ALT: 9 U/L (ref 6–29)
AST: 12 U/L (ref 10–30)
Albumin: 4.6 g/dL (ref 3.6–5.1)
Alkaline Phosphatase: 63 U/L (ref 33–115)
Bilirubin, Direct: 0.1 mg/dL (ref <=0.2)
Indirect Bilirubin: 0.3 mg/dL (ref 0.2–1.2)
Total Bilirubin: 0.4 mg/dL (ref 0.2–1.2)
Total Protein: 7.1 g/dL (ref 6.1–8.1)

## 2016-06-08 LAB — CBC WITH DIFFERENTIAL/PLATELET
Basophils Absolute: 0 cells/uL (ref 0–200)
Basophils Relative: 0 %
EOS PCT: 1 %
Eosinophils Absolute: 96 cells/uL (ref 15–500)
HCT: 41.9 % (ref 35.0–45.0)
HEMOGLOBIN: 13.6 g/dL (ref 11.7–15.5)
LYMPHS ABS: 2304 {cells}/uL (ref 850–3900)
Lymphocytes Relative: 24 %
MCH: 28.6 pg (ref 27.0–33.0)
MCHC: 32.5 g/dL (ref 32.0–36.0)
MCV: 88 fL (ref 80.0–100.0)
MONO ABS: 480 {cells}/uL (ref 200–950)
MPV: 11 fL (ref 7.5–12.5)
Monocytes Relative: 5 %
NEUTROS ABS: 6720 {cells}/uL (ref 1500–7800)
Neutrophils Relative %: 70 %
Platelets: 251 10*3/uL (ref 140–400)
RBC: 4.76 MIL/uL (ref 3.80–5.10)
RDW: 13.8 % (ref 11.0–15.0)
WBC: 9.6 10*3/uL (ref 3.8–10.8)

## 2016-06-08 LAB — LIPID PANEL
CHOL/HDL RATIO: 3.1 ratio (ref <=5.0)
Cholesterol: 154 mg/dL (ref 125–200)
HDL: 49 mg/dL (ref >=46)
LDL CALC: 88 mg/dL (ref <130)
Triglycerides: 83 mg/dL (ref <150)
VLDL: 17 mg/dL (ref <30)

## 2016-06-08 LAB — MAGNESIUM: MAGNESIUM: 2.1 mg/dL (ref 1.5–2.5)

## 2016-06-08 LAB — TSH: TSH: 0.93 m[IU]/L

## 2016-06-08 LAB — VITAMIN B12: VITAMIN B 12: 271 pg/mL (ref 200–1100)

## 2016-06-09 LAB — URINALYSIS, ROUTINE W REFLEX MICROSCOPIC
Bilirubin Urine: NEGATIVE
Glucose, UA: NEGATIVE
Hgb urine dipstick: NEGATIVE
KETONES UR: NEGATIVE
LEUKOCYTES UA: NEGATIVE
NITRITE: NEGATIVE
PH: 6 (ref 5.0–8.0)
Protein, ur: NEGATIVE
Specific Gravity, Urine: 1.02 (ref 1.001–1.035)

## 2016-06-09 LAB — VITAMIN D 25 HYDROXY (VIT D DEFICIENCY, FRACTURES): VIT D 25 HYDROXY: 34 ng/mL (ref 30–100)

## 2016-06-09 LAB — HEMOGLOBIN A1C
HEMOGLOBIN A1C: 4.9 % (ref <5.7)
Mean Plasma Glucose: 94 mg/dL

## 2016-06-09 LAB — INSULIN, RANDOM: Insulin: 6.2 u[IU]/mL (ref 2.0–19.6)

## 2016-06-10 LAB — TB SKIN TEST
Induration: 0 mm
TB SKIN TEST: NEGATIVE

## 2016-07-09 ENCOUNTER — Other Ambulatory Visit: Payer: Self-pay | Admitting: Obstetrics and Gynecology

## 2016-07-13 ENCOUNTER — Encounter (HOSPITAL_COMMUNITY): Payer: Self-pay | Admitting: *Deleted

## 2016-07-16 NOTE — Patient Instructions (Addendum)
Your procedure is scheduled on:  Thursday, Sept. 21, 2017  Enter through the [employer] of [facility] at:  7:30 AM  Pick up the phone at the desk and dial [phone].  Call this number if you have problems the morning of surgery: [phone].  Remember: Do NOT eat food or drink after:  Midnight Wednesday  Take these medicines the morning of surgery with a SIP OF WATER:  None  Do NOT wear jewelry (body piercing), metal hair clips/bobby pins, make-up, or nail polish. Do NOT wear lotions, powders, or perfumes.  You may wear deodorant. Do NOT shave for 48 hours prior to surgery. Do NOT bring valuables to the hospital. Contacts, dentures, or bridgework may not be worn into surgery.  Have a responsible adult drive you home and stay with you for 24 hours after your procedure

## 2016-07-19 ENCOUNTER — Encounter (HOSPITAL_COMMUNITY): Payer: Self-pay

## 2016-07-19 ENCOUNTER — Encounter (HOSPITAL_COMMUNITY)
Admission: RE | Admit: 2016-07-19 | Discharge: 2016-07-19 | Disposition: A | Discharge status: Home / Self Care | Payer: Managed Care, Other (non HMO) | Source: Ambulatory Visit | Attending: Obstetrics and Gynecology | Admitting: Obstetrics and Gynecology

## 2016-07-19 DIAGNOSIS — Z302 Encounter for sterilization: Secondary | ICD-10-CM | POA: Diagnosis present

## 2016-07-19 DIAGNOSIS — Z87891 Personal history of nicotine dependence: Secondary | ICD-10-CM | POA: Diagnosis not present

## 2016-07-19 HISTORY — DX: Furuncle, unspecified: L02.92

## 2016-07-19 HISTORY — DX: Nontoxic goiter, unspecified: E04.9

## 2016-07-19 HISTORY — DX: Cardiac murmur, unspecified: R01.1

## 2016-07-19 LAB — CBC
HCT: 38.6 % (ref 36.0–46.0)
HEMOGLOBIN: 13 g/dL (ref 12.0–15.0)
MCH: 28.4 pg (ref 26.0–34.0)
MCHC: 33.7 g/dL (ref 30.0–36.0)
MCV: 84.5 fL (ref 78.0–100.0)
PLATELETS: 217 10*3/uL (ref 150–400)
RBC: 4.57 MIL/uL (ref 3.87–5.11)
RDW: 13.5 % (ref 11.5–15.5)
WBC: 7 10*3/uL (ref 4.0–10.5)

## 2016-07-19 LAB — SURGICAL PCR SCREEN
MRSA, PCR: INVALID — AB
STAPHYLOCOCCUS AUREUS: INVALID — AB

## 2016-07-21 NOTE — Anesthesia Preprocedure Evaluation (Signed)
Anesthesia Evaluation  Patient identified by MRN, date of birth, ID band Patient awake    Reviewed: Allergy & Precautions, NPO status , Patient's Chart, lab work & pertinent test results  History of Anesthesia Complications Negative for: history of anesthetic complications  Airway Mallampati: II  TM Distance: >3 FB Neck ROM: Full    Dental no notable dental hx. (+) Dental Advisory Given   Pulmonary former smoker,    Pulmonary exam normal breath sounds clear to auscultation       Cardiovascular negative cardio ROS Normal cardiovascular exam Rhythm:Regular Rate:Normal     Neuro/Psych PSYCHIATRIC DISORDERS negative neurological ROS     GI/Hepatic negative GI ROS, Neg liver ROS,   Endo/Other  negative endocrine ROS  Renal/GU negative Renal ROS  negative genitourinary   Musculoskeletal negative musculoskeletal ROS (+)   Abdominal   Peds negative pediatric ROS (+)  Hematology negative hematology ROS (+)   Anesthesia Other Findings   Reproductive/Obstetrics negative OB ROS                             Anesthesia Physical Anesthesia Plan  ASA: II  Anesthesia Plan: General   Post-op Pain Management:    Induction: Intravenous  Airway Management Planned: Oral ETT  Additional Equipment:   Intra-op Plan:   Post-operative Plan: Extubation in OR  Informed Consent: I have reviewed the patients History and Physical, chart, labs and discussed the procedure including the risks, benefits and alternatives for the proposed anesthesia with the patient or authorized representative who has indicated his/her understanding and acceptance.   Dental advisory given  Plan Discussed with: CRNA  Anesthesia Plan Comments:         Anesthesia Quick Evaluation

## 2016-07-22 ENCOUNTER — Ambulatory Visit (HOSPITAL_COMMUNITY): Payer: Managed Care, Other (non HMO) | Admitting: Anesthesiology

## 2016-07-22 ENCOUNTER — Encounter (HOSPITAL_COMMUNITY)
Admission: RE | Disposition: A | Discharge status: Home / Self Care | Payer: Self-pay | Source: Ambulatory Visit | Attending: Obstetrics and Gynecology

## 2016-07-22 ENCOUNTER — Encounter (HOSPITAL_COMMUNITY): Payer: Self-pay | Admitting: Emergency Medicine

## 2016-07-22 ENCOUNTER — Ambulatory Visit (HOSPITAL_COMMUNITY)
Admission: RE | Admit: 2016-07-22 | Discharge: 2016-07-22 | Disposition: A | Discharge status: Home / Self Care | Payer: Managed Care, Other (non HMO) | Source: Ambulatory Visit | Attending: Obstetrics and Gynecology | Admitting: Obstetrics and Gynecology

## 2016-07-22 DIAGNOSIS — Z87891 Personal history of nicotine dependence: Secondary | ICD-10-CM | POA: Insufficient documentation

## 2016-07-22 DIAGNOSIS — Z302 Encounter for sterilization: Secondary | ICD-10-CM | POA: Diagnosis not present

## 2016-07-22 HISTORY — PX: LAPAROSCOPIC TUBAL LIGATION: SHX1937

## 2016-07-22 LAB — MRSA CULTURE: Culture: NEGATIVE

## 2016-07-22 LAB — PREGNANCY, URINE: PREG TEST UR: NEGATIVE

## 2016-07-22 SURGERY — LIGATION, FALLOPIAN TUBE, LAPAROSCOPIC
Anesthesia: General | Site: Abdomen | Laterality: Bilateral

## 2016-07-22 MED ORDER — METOCLOPRAMIDE HCL 5 MG/ML IJ SOLN
INTRAMUSCULAR | Status: DC | PRN
  Administered 2016-07-22: 10 mg via INTRAVENOUS

## 2016-07-22 MED ORDER — DEXAMETHASONE SODIUM PHOSPHATE 10 MG/ML IJ SOLN
INTRAMUSCULAR | Status: AC
  Filled 2016-07-22: qty 1

## 2016-07-22 MED ORDER — MIDAZOLAM HCL 2 MG/2ML IJ SOLN
INTRAMUSCULAR | Status: DC | PRN
  Administered 2016-07-22: 2 mg via INTRAVENOUS

## 2016-07-22 MED ORDER — PROPOFOL 10 MG/ML IV BOLUS
INTRAVENOUS | Status: AC
  Filled 2016-07-22: qty 20

## 2016-07-22 MED ORDER — KETOROLAC TROMETHAMINE 30 MG/ML IJ SOLN
INTRAMUSCULAR | Status: DC | PRN
  Administered 2016-07-22: 30 mg via INTRAMUSCULAR
  Administered 2016-07-22: 30 mg via INTRAVENOUS

## 2016-07-22 MED ORDER — LIDOCAINE HCL (CARDIAC) 20 MG/ML IV SOLN
INTRAVENOUS | Status: AC
  Filled 2016-07-22: qty 5

## 2016-07-22 MED ORDER — SCOPOLAMINE 1 MG/3DAYS TD PT72
1.0000 | MEDICATED_PATCH | Freq: Once | TRANSDERMAL | Status: DC
  Administered 2016-07-22: 1.5 mg via TRANSDERMAL

## 2016-07-22 MED ORDER — ONDANSETRON HCL 4 MG/2ML IJ SOLN
4.0000 mg | Freq: Once | INTRAMUSCULAR | Status: AC | PRN
  Administered 2016-07-22: 4 mg via INTRAVENOUS

## 2016-07-22 MED ORDER — FENTANYL CITRATE (PF) 100 MCG/2ML IJ SOLN
INTRAMUSCULAR | Status: AC
  Filled 2016-07-22: qty 2

## 2016-07-22 MED ORDER — FENTANYL CITRATE (PF) 100 MCG/2ML IJ SOLN
INTRAMUSCULAR | Status: DC | PRN
  Administered 2016-07-22 (×3): 50 ug via INTRAVENOUS

## 2016-07-22 MED ORDER — FENTANYL CITRATE (PF) 100 MCG/2ML IJ SOLN
INTRAMUSCULAR | Status: AC
  Filled 2016-07-22: qty 4

## 2016-07-22 MED ORDER — HYDROMORPHONE HCL 1 MG/ML IJ SOLN
INTRAMUSCULAR | Status: DC | PRN
  Administered 2016-07-22 (×2): 0.5 mg via INTRAVENOUS

## 2016-07-22 MED ORDER — ROCURONIUM BROMIDE 100 MG/10ML IV SOLN
INTRAVENOUS | Status: AC
  Filled 2016-07-22: qty 1

## 2016-07-22 MED ORDER — SUGAMMADEX SODIUM 200 MG/2ML IV SOLN
INTRAVENOUS | Status: DC | PRN
  Administered 2016-07-22: 150.8 mg via INTRAVENOUS

## 2016-07-22 MED ORDER — SCOPOLAMINE 1 MG/3DAYS TD PT72
MEDICATED_PATCH | TRANSDERMAL | Status: AC
  Administered 2016-07-22: 1.5 mg via TRANSDERMAL
  Filled 2016-07-22: qty 1

## 2016-07-22 MED ORDER — ONDANSETRON HCL 4 MG/2ML IJ SOLN
INTRAMUSCULAR | Status: AC
  Filled 2016-07-22: qty 2

## 2016-07-22 MED ORDER — PROPOFOL 10 MG/ML IV BOLUS
INTRAVENOUS | Status: DC | PRN
  Administered 2016-07-22: 150 mg via INTRAVENOUS

## 2016-07-22 MED ORDER — BUPIVACAINE HCL (PF) 0.25 % IJ SOLN
INTRAMUSCULAR | Status: DC | PRN
  Administered 2016-07-22: 6 mL

## 2016-07-22 MED ORDER — MIDAZOLAM HCL 2 MG/2ML IJ SOLN
INTRAMUSCULAR | Status: AC
  Filled 2016-07-22: qty 2

## 2016-07-22 MED ORDER — DEXAMETHASONE SODIUM PHOSPHATE 4 MG/ML IJ SOLN
INTRAMUSCULAR | Status: DC | PRN
  Administered 2016-07-22: 4 mg via INTRAVENOUS

## 2016-07-22 MED ORDER — BUPIVACAINE HCL (PF) 0.25 % IJ SOLN
INTRAMUSCULAR | Status: AC
  Filled 2016-07-22: qty 30

## 2016-07-22 MED ORDER — LIDOCAINE HCL (CARDIAC) 20 MG/ML IV SOLN
INTRAVENOUS | Status: DC | PRN
  Administered 2016-07-22: 100 mg via INTRAVENOUS

## 2016-07-22 MED ORDER — LACTATED RINGERS IV SOLN
INTRAVENOUS | Status: DC
  Administered 2016-07-22 (×2): via INTRAVENOUS

## 2016-07-22 MED ORDER — METOCLOPRAMIDE HCL 5 MG/ML IJ SOLN
INTRAMUSCULAR | Status: AC
  Filled 2016-07-22: qty 2

## 2016-07-22 MED ORDER — IBUPROFEN 800 MG PO TABS: 800.0000 mg | ORAL_TABLET | Freq: Three times a day (TID) | ORAL | 0 refills | Status: DC | PRN

## 2016-07-22 MED ORDER — HYDROMORPHONE HCL 1 MG/ML IJ SOLN
INTRAMUSCULAR | Status: AC
  Filled 2016-07-22: qty 1

## 2016-07-22 MED ORDER — NEOSTIGMINE METHYLSULFATE 10 MG/10ML IV SOLN
INTRAVENOUS | Status: AC
  Filled 2016-07-22: qty 1

## 2016-07-22 MED ORDER — ONDANSETRON HCL 4 MG/2ML IJ SOLN
INTRAMUSCULAR | Status: DC | PRN
Start: 2016-07-22 — End: 2016-07-22
  Administered 2016-07-22: 4 mg via INTRAVENOUS

## 2016-07-22 MED ORDER — FENTANYL CITRATE (PF) 100 MCG/2ML IJ SOLN
25.0000 ug | INTRAMUSCULAR | Status: DC | PRN
  Administered 2016-07-22: 50 ug via INTRAVENOUS

## 2016-07-22 MED ORDER — ROCURONIUM BROMIDE 100 MG/10ML IV SOLN
INTRAVENOUS | Status: DC | PRN
  Administered 2016-07-22: 40 mg via INTRAVENOUS

## 2016-07-22 MED ORDER — SUGAMMADEX SODIUM 200 MG/2ML IV SOLN
INTRAVENOUS | Status: AC
  Filled 2016-07-22: qty 2

## 2016-07-22 SURGICAL SUPPLY — 19 items
CATH ROBINSON RED A/P 16FR (CATHETERS) ×2 IMPLANT
CLOTH BEACON ORANGE TIMEOUT ST (SAFETY) ×2 IMPLANT
DRSG OPSITE POSTOP 3X4 (GAUZE/BANDAGES/DRESSINGS) ×1 IMPLANT
DURAPREP 26ML APPLICATOR (WOUND CARE) ×2 IMPLANT
GLOVE BIOGEL PI IND STRL 7.0 (GLOVE) ×2 IMPLANT
GLOVE BIOGEL PI INDICATOR 7.0 (GLOVE) ×2
GLOVE ECLIPSE 6.5 STRL STRAW (GLOVE) ×2 IMPLANT
GOWN STRL REUS W/TWL LRG LVL3 (GOWN DISPOSABLE) ×4 IMPLANT
NEEDLE INSUFFLATION 120MM (ENDOMECHANICALS) ×2 IMPLANT
PACK LAPAROSCOPY BASIN (CUSTOM PROCEDURE TRAY) ×2 IMPLANT
PAD TRENDELENBURG POSITION (MISCELLANEOUS) ×2 IMPLANT
PROTECTOR NERVE ULNAR (MISCELLANEOUS) ×4 IMPLANT
SLEEVE XCEL OPT CAN 5 100 (ENDOMECHANICALS) ×2 IMPLANT
SUT VICRYL 0 UR6 27IN ABS (SUTURE) ×2 IMPLANT
SUT VICRYL 4-0 PS2 18IN ABS (SUTURE) ×2 IMPLANT
TOWEL OR 17X24 6PK STRL BLUE (TOWEL DISPOSABLE) ×4 IMPLANT
TROCAR OPTI TIP 5M 100M (ENDOMECHANICALS) ×2 IMPLANT
TROCAR XCEL DIL TIP R 11M (ENDOMECHANICALS) ×2 IMPLANT
WATER STERILE IRR 1000ML POUR (IV SOLUTION) ×2 IMPLANT

## 2016-07-22 NOTE — Transfer of Care (Signed)
Immediate Anesthesia Transfer of Care Note  Patient: Stacy Vaughan  Procedure(s) Performed: Procedure(s) with comments: LAPAROSCOPIC TUBAL LIGATION With Bipolar Cautery (Bilateral) - 1hr. requested  Patient Location: PACU  Anesthesia Type:General  Level of Consciousness: awake, alert  and oriented  Airway & Oxygen Therapy: Patient Spontanous Breathing and Patient connected to nasal cannula oxygen  Post-op Assessment: Report given to RN and Post -op Vital signs reviewed and stable  Post vital signs: Reviewed and stable  Last Vitals:  Vitals:   07/22/16 0735  BP: 125/87  Pulse: 83  Resp: 16  Temp: 36.8 C    Last Pain:  Vitals:   07/22/16 0735  TempSrc: Oral      Patients Stated Pain Goal: 3 ([phone]  Complications: No apparent anesthesia complications

## 2016-07-22 NOTE — Discharge Instructions (Signed)
Warm heat to abdomen every 4 hours x 24 hrs. Ambulate.  Nothing per vagina x 1 week    DISCHARGE INSTRUCTIONS: Laparoscopy  The following instructions have been prepared to help you care for yourself upon your return home today.  Wound care:  Do not get the incision wet for the first 24 hours. The incision should be kept clean and dry.  The Band-Aids or dressings may be removed the day after surgery.  Should the incision become sore, red, and swollen after the first week, check with your doctor.  Personal hygiene:  Shower the day after your procedure.  Activity and limitations:  Do NOT drive or operate any equipment today.  Do NOT lift anything more than 15 pounds for 2-3 weeks after surgery.  Do NOT rest in bed all day.  Walking is encouraged. Walk each day, starting slowly with 5-minute walks 3 or 4 times a day. Slowly increase the length of your walks.  Walk up and down stairs slowly.  Do NOT do strenuous activities, such as golfing, playing tennis, bowling, running, biking, weight lifting, gardening, mowing, or vacuuming for 2-4 weeks. Ask your doctor when it is okay to start.  Diet: Eat a light meal as desired this evening. You may resume your usual diet tomorrow.  Return to work: This is dependent on the type of work you do. For the most part you can return to a desk job within a week of surgery. If you are more active at work, please discuss this with your doctor.  What to expect after your surgery: You may have a slight burning sensation when you urinate on the first day. You may have a very small amount of blood in the urine. Expect to have a small amount of vaginal discharge/light bleeding for 1-2 weeks. It is not unusual to have abdominal soreness and bruising for up to 2 weeks. You may be tired and need more rest for about 1 week. You may experience shoulder pain for 24-72 hours. Lying flat in bed may relieve it.  Call your doctor for any of the following:   Develop a fever of 100.4 or greater  Inability to urinate 6 hours after discharge from hospital  Severe pain not relieved by pain medications  Persistent of heavy bleeding at incision site  Redness or swelling around incision site after a week  Increasing nausea or vomiting  Patient Signature________________________________________ Nurse Signature_________________________________________    Post Anesthesia Home Care Instructions  Activity: Get plenty of rest for the remainder of the day. A responsible adult should stay with you for 24 hours following the procedure.  For the next 24 hours, DO NOT: -Drive a car -Paediatric nurse -Drink alcoholic beverages -Take any medication unless instructed by your physician -Make any legal decisions or sign important papers.  Meals: Start with liquid foods such as gelatin or soup. Progress to regular foods as tolerated. Avoid greasy, spicy, heavy foods. If nausea and/or vomiting occur, drink only clear liquids until the nausea and/or vomiting subsides. Call your physician if vomiting continues.  Special Instructions/Symptoms: Your throat may feel dry or sore from the anesthesia or the breathing tube placed in your throat during surgery. If this causes discomfort, gargle with warm salt water. The discomfort should disappear within 24 hours.  If you had a scopolamine patch placed behind your ear for the management of post- operative nausea and/or vomiting:  1. The medication in the patch is effective for 72 hours, after which it should be removed.  Wrap patch in a tissue and discard in the trash. Wash hands thoroughly with soap and water. 2. You may remove the patch earlier than 72 hours if you experience unpleasant side effects which may include dry mouth, dizziness or visual disturbances. 3. Avoid touching the patch. Wash your hands with soap and water after contact with the patch.

## 2016-07-22 NOTE — Anesthesia Procedure Notes (Signed)
Procedure Name: Intubation Date/Time: 07/22/2016 9:08 AM Performed by: Georgeanne Nim Pre-anesthesia Checklist: Patient identified, Timeout performed, Emergency Drugs available, Suction available and Patient being monitored Patient Re-evaluated:Patient Re-evaluated prior to inductionOxygen Delivery Method: Circle system utilized Preoxygenation: Pre-oxygenation with 100% oxygen Intubation Type: IV induction Ventilation: Mask ventilation without difficulty Laryngoscope Size: Mac and 3 Grade View: Grade I Tube type: Oral Tube size: 7.0 mm Number of attempts: 1 Airway Equipment and Method: Stylet Placement Confirmation: ETT inserted through vocal cords under direct vision,  positive ETCO2,  CO2 detector and breath sounds checked- equal and bilateral Secured at: 21 cm Tube secured with: Tape Dental Injury: Teeth and Oropharynx as per pre-operative assessment

## 2016-07-22 NOTE — Brief Op Note (Signed)
07/22/2016  10:19 AM  PATIENT:  Max Fickle  25 y.o. female  PRE-OPERATIVE DIAGNOSIS:  Desires Sterilization  POST-OPERATIVE DIAGNOSIS:  desires sterilization  PROCEDURE:  Laparoscopic tubal ligation with bipolar cautery  SURGEON:  Surgeon(s) and Role:    * Corporate treasurer, MD - Primary  PHYSICIAN ASSISTANT:   ASSISTANTS: none   ANESTHESIA:   general Findings: nl tubes and ovaries, nl appendix, nl liver edge, no evidence for pelvic endometriosis EBL:  Total I/O In: 1100 [I.V.:1100] Out: 53 [Urine:50; Blood:3]  BLOOD ADMINISTERED:none  DRAINS: none   LOCAL MEDICATIONS USED:  MARCAINE     SPECIMEN:  No Specimen  DISPOSITION OF SPECIMEN:  N/A  COUNTS:  YES  TOURNIQUET:  * No tourniquets in log *  DICTATION: .Other Dictation: Dictation Number [id number]  PLAN OF CARE: Discharge to home after PACU  PATIENT DISPOSITION:  PACU - hemodynamically stable.   Delay start of Pharmacological VTE agent (>24hrs) due to surgical blood loss or risk of bleeding: no

## 2016-07-23 ENCOUNTER — Encounter (HOSPITAL_COMMUNITY): Payer: Self-pay | Admitting: Obstetrics and Gynecology

## 2016-07-23 NOTE — Op Note (Signed)
Stacy Vaughan, Stacy Vaughan            ACCOUNT NO.:  [id number]  MEDICAL RECORD NO.:  [id number]  LOCATION:  [facility]                          FACILITY:  [facility]  PHYSICIAN:  Servando Salina, M.D.DATE OF BIRTH:  May 03, 1991  DATE OF PROCEDURE:  07/22/2016 DATE OF DISCHARGE:  07/22/2016                              OPERATIVE REPORT   PREOPERATIVE DIAGNOSIS:  Desires sterilization.  PROCEDURE:  Laparoscopic tubal ligation with bipolar cautery.  POSTOPERATIVE DIAGNOSIS:  Desires sterilization.  ANESTHESIA:  General.  SURGEON:  Servando Salina, M.D.  ASSISTANT:  None.  DESCRIPTION OF PROCEDURE:  Under adequate general anesthesia, the patient was placed in the dorsal lithotomy position.  She was sterilely prepped and draped in usual fashion, and the bladder was catheterized with small amount of urine.  Examination revealed an anteverted uterus. No adnexal masses could be appreciated.  Bivalve speculum was placed in vagina, single-tooth tenaculum was placed on the anterior lip of the cervix, and acorn cannula was introduced into the cervical os and attached to the tenaculum for manipulation of the uterus.  The bivalve speculum was then removed.  Attention was then turned to the abdomen. The patient had multiple tattoos. Marcaine 0.25% was injected infraumbilically.  A small vertical infraumbilical incision was made from prior scar.  Veress needle was introduced.  Opening pressure of 8 was noted, 2.5 L of CO2 was insufflated.  Veress needle was then removed.  A 10 mm disposable trocar with sleeve was introduced into the abdomen without incident.  A lighted videolaparoscope was introduced into the port to confirm entry into the abdomen without incident.  The panoramic inspection resulted in normal liver edge been noted, normal appendix been seen.  The patient was placed in Trendelenburg position, and with the uterine manipulation, the uterus was noted to be normal. Both ovaries were noted  to be normal.  Tubes were normal.  A second site was placed, the 0.25% Marcaine was injected suprapubic area.  A small incision was then made, a 5-mm port site was placed, and a probe was then used to inspect the pelvis.  No endometriosis was noted in the anterior or posterior cul-de-sac.  The bipolar cautery was then used to burn the midportion of both fallopian tubes about 1 cm on both sides. When good cauterization was performed, the suprapubic site was removed under direct visualization.  The infraumbilical site was brought up taking care not to bring up any underlying organ structures, and the abdomen was deflated.  The incisions were closed with 4-0 Vicryl subcuticular skin incision and 0 Vicryl figure-of-eight for the fascia. Instruments from the vagina were removed.  SPECIMENS:  None.  ESTIMATED BLOOD LOSS:  Minimal.  COMPLICATIONS:  None.  The patient tolerated the procedure well, was transferred to recovery in stable condition.     Servando Salina, M.D.     [place]/MEDQ  D:  07/22/2016  T:  07/23/2016  Job:  [id number]

## 2016-07-24 NOTE — Anesthesia Postprocedure Evaluation (Signed)
Anesthesia Post Note  Patient: Stacy Vaughan  Procedure(s) Performed: Procedure(s) (LRB): LAPAROSCOPIC TUBAL LIGATION With Bipolar Cautery (Bilateral)  Vital Signs Assessment: post-procedure vital signs reviewed and stable Anesthetic complications: no     Last Vitals:  Vitals:   07/22/16 1130 07/22/16 1210  BP:  118/74  Pulse: 79 81  Resp: 16 16  Temp: 36.9 C 36.9 C    Last Pain:  Vitals:   07/22/16 1210  TempSrc:   PainSc: 2    Pain Goal: Patients Stated Pain Goal: 3 (07/22/16 0735)               Krystie Leiter JENNETTE

## 2016-11-01 HISTORY — PX: BREAST ENHANCEMENT SURGERY: SHX7

## 2017-07-11 ENCOUNTER — Encounter: Payer: Self-pay | Admitting: Internal Medicine

## 2017-07-19 ENCOUNTER — Encounter: Payer: Self-pay | Admitting: Adult Health

## 2017-07-19 NOTE — Assessment & Plan Note (Deleted)
   Recommend supplementation  Check vitamin D level

## 2017-07-19 NOTE — Progress Notes (Signed)
Patient ID: Stacy Vaughan, female   DOB: 10/05/1991, 26 y.o.   MRN: [id number]   Complete Physical  Assessment and Plan: Problem List    ADD (attention deficit disorder)   Overview    Formerly on vyvanse, stopped 2016 Currently functions well without treatment      Idiopathic scoliosis and kyphoscoliosis   Overview    Mild, noted in childhood, no interventions recommended at the time.       Medication management   Relevant Orders   CBC with Differential/Platelet   BASIC METABOLIC PANEL WITH GFR   Vitamin D deficiency   Overview    Takes 5000 IU daily, goal 70-100 Check vitamin D level      Relevant Orders   VITAMIN D 25 Hydroxy (Vit-D Deficiency, Fractures)   General encounter for screening of a well adult Orders Placed This Encounter  Procedures  . CBC with Differential/Platelet  . BASIC METABOLIC PANEL WITH GFR  . Hepatic function panel  . TSH  . Insulin, fasting  . VITAMIN D 25 Hydroxy (Vit-D Deficiency, Fractures)  . Microalbumin / creatinine urine ratio  . Urinalysis, Complete (81001)    Screening labs and tests as requested with regular follow-up as recommended. Over 40 minutes of exam, counseling, chart review, and complex, high level critical decision making was performed this visit.   Future Appointments Date Time Provider [place]  08/21/2018 10:00 AM Unk Pinto, MD [facility] None     HPI  26 y.o. female pleasant caucasian female presents for a complete physical and follow up for has ADD (attention deficit disorder); Idiopathic scoliosis and kyphoscoliosis; Vitamin D deficiency; and Medication management on her problem list.. She has no concerns today. Full history review and update performed.   She is a Haematologist, currently separated, two daughters- 25 y/o and 18 y/o.   Diet: Reducing red meat- watches portions.  Exercise:  None- on feet all day.  Water: ~80 oz water, 2 cups of coffee  BMI is Body mass index is 21.13 kg/m.,  she is working on diet. Wt Readings from Last 3 Encounters:  07/20/17 139 lb (63 kg)  07/19/16 166 lb 2 oz (75.4 kg)  06/08/16 169 lb 3.2 oz (76.7 kg)   The cholesterol last visit was:   Lab Results  Component Value Date   CHOL 154 06/08/2016   HDL 49 06/08/2016   LDLCALC 88 06/08/2016   TRIG 83 06/08/2016   CHOLHDL 3.1 06/08/2016   Last A1C in the office was:  Lab Results  Component Value Date   HGBA1C 4.9 06/08/2016    Last GFR: Lab Results  Component Value Date   GFRNONAA 78 06/08/2016    Patient is on Vitamin D supplement.   Lab Results  Component Value Date   VD25OH 34 06/08/2016      Current Medications:  No current outpatient prescriptions on file prior to visit.   No current facility-administered medications on file prior to visit.    Allergies:  No Known Allergies Medical History:  She has ADD (attention deficit disorder); Idiopathic scoliosis and kyphoscoliosis; Vitamin D deficiency; and Medication management on her problem list.  Previously has had an enlarged thyroid.   Health Maintenance:   Immunization History  Administered Date(s) Administered  . DTaP 11/01/2013  . PPD Test 05/30/2014, 06/02/2015, 06/08/2016  . Td 06/01/2006    Tetanus: DUE 2017 Declined- counseled  Pneumovax: N/A Prevnar 13: N/A Flu vaccine: Declined - counseled Zostavax: N/A LMP: Patient's last menstrual period was 06/05/2017.         (  tubal libation 2017) Pap: 06/15/2016- Defer to Dr. Garwin Brothers GYN MGM: N/A DEXA: N/A Colonoscopy: N/A EGD: N/A  Last Dental Exam: 2018 Last Eye Exam: N/A Patient Care Team: Unk Pinto, MD as PCP - General (Internal Medicine)  Surgical History:  She has a past surgical history that includes Cholecystectomy (2003); Wisdom tooth extraction (2008); Laparoscopic tubal ligation (Bilateral, 07/22/2016); and Cosmetic surgery. Family History:  Herfamily history includes Diabetes Mellitus II in her maternal uncle; Hyperlipidemia in her  father; Hypertension in her maternal grandmother; Kidney cancer in her maternal grandmother; Migraines in her mother. Social History:  She reports that she has quit smoking. She has never used smokeless tobacco. She reports that she drinks alcohol. She reports that she does not use drugs.  Review of Systems: Review of Systems  Constitutional: Negative.  Negative for chills, fever, malaise/fatigue and weight loss.  HENT: Negative for congestion, hearing loss, sore throat and tinnitus.   Eyes: Negative for blurred vision.  Respiratory: Negative for cough, shortness of breath and wheezing.   Cardiovascular: Negative for chest pain, palpitations and leg swelling.  Gastrointestinal: Negative for abdominal pain, blood in stool, constipation, diarrhea, heartburn, melena, nausea and vomiting.  Genitourinary: Negative.  Negative for dysuria, frequency and urgency.  Musculoskeletal: Negative for joint pain and myalgias. Neck pain: Occasional lower back pain/foot pain after work.  Skin: Negative.   Neurological: Negative for dizziness, tingling, sensory change and headaches.  Endo/Heme/Allergies: Negative.   Psychiatric/Behavioral: Negative.  Negative for depression. The patient is not nervous/anxious and does not have insomnia.     Physical Exam: Estimated body mass index is 21.13 kg/m as calculated from the following:   Height as of this encounter: 5\' 8"  (1.727 m).   Weight as of this encounter: 139 lb (63 kg). BP 118/76   Pulse 61   Temp (!) 97.5 F (36.4 C)   Resp 18   Ht 5\' 8"  (1.727 m)   Wt 139 lb (63 kg)   LMP 06/05/2017   SpO2 95%   BMI 21.13 kg/m    General Appearance: Well nourished, in no apparent distress.  Eyes: PERRLA, EOMs, conjunctiva no swelling or erythema, normal fundi and vessels.  Sinuses: No Frontal/maxillary tenderness  ENT/Mouth: Ext aud canals clear, normal light reflex with TMs without erythema, bulging. Good dentition. No erythema, swelling, or exudate on post  pharynx. Tonsils not swollen or erythematous. Hearing normal.  Neck: Supple, thyroid normal. No bruits  Respiratory: Respiratory effort normal, BS equal bilaterally without rales, rhonchi, wheezing or stridor.  Cardio: RRR without murmurs, rubs or gallops. Brisk peripheral pulses without edema.  Chest: symmetric, with normal excursions and percussion.  Breasts: Deferred to GYN  Abdomen: Soft, nontender, no guarding, rebound, hernias, masses, or organomegaly.  Lymphatics: Non tender without lymphadenopathy.  Genitourinary: Deferred to GYN Musculoskeletal: Full ROM all peripheral extremities,5/5 strength, and normal gait. Mild mid/lower spinal scoliosis noted.  Skin: Warm, dry without rashes, lesions, ecchymosis. Neuro: Cranial nerves intact, reflexes equal bilaterally. Normal muscle tone, no cerebellar symptoms. Sensation intact.  Psych: Awake and oriented X 3, normal affect, Insight and Judgment appropriate.    Gorden Harms Rateel Beldin 10:54 AM [facility]

## 2017-07-19 NOTE — Assessment & Plan Note (Deleted)
   Was on medication in the past

## 2017-07-20 ENCOUNTER — Ambulatory Visit (INDEPENDENT_AMBULATORY_CARE_PROVIDER_SITE_OTHER): Payer: BLUE CROSS/BLUE SHIELD | Admitting: Adult Health

## 2017-07-20 ENCOUNTER — Encounter: Payer: Self-pay | Admitting: Adult Health

## 2017-07-20 VITALS — BP 118/76 | HR 61 | Temp 97.5°F | Resp 18 | Ht 68.0 in | Wt 139.0 lb

## 2017-07-20 DIAGNOSIS — I10 Essential (primary) hypertension: Secondary | ICD-10-CM | POA: Diagnosis not present

## 2017-07-20 DIAGNOSIS — F988 Other specified behavioral and emotional disorders with onset usually occurring in childhood and adolescence: Secondary | ICD-10-CM

## 2017-07-20 DIAGNOSIS — Z13220 Encounter for screening for lipoid disorders: Secondary | ICD-10-CM

## 2017-07-20 DIAGNOSIS — Z136 Encounter for screening for cardiovascular disorders: Secondary | ICD-10-CM

## 2017-07-20 DIAGNOSIS — Z130 Encounter for screening for diseases of the blood and blood-forming organs and certain disorders involving the immune mechanism: Secondary | ICD-10-CM

## 2017-07-20 DIAGNOSIS — E559 Vitamin D deficiency, unspecified: Secondary | ICD-10-CM

## 2017-07-20 DIAGNOSIS — Z0000 Encounter for general adult medical examination without abnormal findings: Secondary | ICD-10-CM | POA: Diagnosis not present

## 2017-07-20 DIAGNOSIS — Z131 Encounter for screening for diabetes mellitus: Secondary | ICD-10-CM

## 2017-07-20 DIAGNOSIS — M4120 Other idiopathic scoliosis, site unspecified: Secondary | ICD-10-CM

## 2017-07-20 DIAGNOSIS — Z1329 Encounter for screening for other suspected endocrine disorder: Secondary | ICD-10-CM

## 2017-07-20 DIAGNOSIS — Z79899 Other long term (current) drug therapy: Secondary | ICD-10-CM

## 2017-07-20 DIAGNOSIS — Z1389 Encounter for screening for other disorder: Secondary | ICD-10-CM

## 2017-07-20 NOTE — Patient Instructions (Signed)
Preventive Care for Adults A healthy lifestyle and preventive care can promote health and wellness. Preventive health guidelines for women include the following key practices.  A routine yearly physical is a good way to check with your health care provider about your health and preventive screening. It is a chance to share any concerns and updates on your health and to receive a thorough exam.  Visit your dentist for a routine exam and preventive care every 6 months. Brush your teeth twice a day and floss once a day. Good oral hygiene prevents tooth decay and gum disease.  The frequency of eye exams is based on your age, health, family medical history, use of contact lenses, and other factors. Follow your health care provider's recommendations for frequency of eye exams.  Eat a healthy diet. Foods like vegetables, fruits, whole grains, low-fat dairy products, and lean protein foods contain the nutrients you need without too many calories. Decrease your intake of foods high in solid fats, added sugars, and salt. Eat the right amount of calories for you.Get information about a proper diet from your health care provider, if necessary.  Regular physical exercise is one of the most important things you can do for your health. Most adults should get at least 150 minutes of moderate-intensity exercise (any activity that increases your heart rate and causes you to sweat) each week. In addition, most adults need muscle-strengthening exercises on 2 or more days a week.  Maintain a healthy weight. The body mass index (BMI) is a screening tool to identify possible weight problems. It provides an estimate of body fat based on height and weight. Your health care provider can find your BMI and can help you achieve or maintain a healthy weight.For adults 20 years and older:  A BMI below 18.5 is considered underweight.  A BMI of 18.5 to 24.9 is normal.  A BMI of 25 to 29.9 is considered overweight.  A BMI of  30 and above is considered obese.  Maintain normal blood lipids and cholesterol levels by exercising and minimizing your intake of saturated fat. Eat a balanced diet with plenty of fruit and vegetables. Blood tests for lipids and cholesterol should begin at age 76 and be repeated every 5 years. If your lipid or cholesterol levels are high, you are over 50, or you are at high risk for heart disease, you may need your cholesterol levels checked more frequently.Ongoing high lipid and cholesterol levels should be treated with medicines if diet and exercise are not working.  If you smoke, find out from your health care provider how to quit. If you do not use tobacco, do not start.  Lung cancer screening is recommended for adults aged 22-80 years who are at high risk for developing lung cancer because of a history of smoking. A yearly low-dose CT scan of the lungs is recommended for people who have at least a 30-pack-year history of smoking and are a current smoker or have quit within the past 15 years. A pack year of smoking is smoking an average of 1 pack of cigarettes a day for 1 year (for example: 1 pack a day for 30 years or 2 packs a day for 15 years). Yearly screening should continue until the smoker has stopped smoking for at least 15 years. Yearly screening should be stopped for people who develop a health problem that would prevent them from having lung cancer treatment.  If you are pregnant, do not drink alcohol. If you are breastfeeding,  be very cautious about drinking alcohol. If you are not pregnant and choose to drink alcohol, do not have more than 1 drink per day. One drink is considered to be 12 ounces (355 mL) of beer, 5 ounces (148 mL) of wine, or 1.5 ounces (44 mL) of liquor.  Avoid use of street drugs. Do not share needles with anyone. Ask for help if you need support or instructions about stopping the use of drugs.  High blood pressure causes heart disease and increases the risk of  stroke. Your blood pressure should be checked at least every 1 to 2 years. Ongoing high blood pressure should be treated with medicines if weight loss and exercise do not work.  If you are 75-52 years old, ask your health care provider if you should take aspirin to prevent strokes.  Diabetes screening involves taking a blood sample to check your fasting blood sugar level. This should be done once every 3 years, after age 15, if you are within normal weight and without risk factors for diabetes. Testing should be considered at a younger age or be carried out more frequently if you are overweight and have at least 1 risk factor for diabetes.  Breast cancer screening is essential preventive care for women. You should practice "breast self-awareness." This means understanding the normal appearance and feel of your breasts and may include breast self-examination. Any changes detected, no matter how small, should be reported to a health care provider. Women in their 58s and 30s should have a clinical breast exam (CBE) by a health care provider as part of a regular health exam every 1 to 3 years. After age 16, women should have a CBE every year. Starting at age 53, women should consider having a mammogram (breast X-ray test) every year. Women who have a family history of breast cancer should talk to their health care provider about genetic screening. Women at a high risk of breast cancer should talk to their health care providers about having an MRI and a mammogram every year.  Breast cancer gene (BRCA)-related cancer risk assessment is recommended for women who have family members with BRCA-related cancers. BRCA-related cancers include breast, ovarian, tubal, and peritoneal cancers. Having family members with these cancers may be associated with an increased risk for harmful changes (mutations) in the breast cancer genes BRCA1 and BRCA2. Results of the assessment will determine the need for genetic counseling and  BRCA1 and BRCA2 testing.  Routine pelvic exams to screen for cancer are no longer recommended for nonpregnant women who are considered low risk for cancer of the pelvic organs (ovaries, uterus, and vagina) and who do not have symptoms. Ask your health care provider if a screening pelvic exam is right for you.  If you have had past treatment for cervical cancer or a condition that could lead to cancer, you need Pap tests and screening for cancer for at least 20 years after your treatment. If Pap tests have been discontinued, your risk factors (such as having a new sexual partner) need to be reassessed to determine if screening should be resumed. Some women have medical problems that increase the chance of getting cervical cancer. In these cases, your health care provider may recommend more frequent screening and Pap tests.  The HPV test is an additional test that may be used for cervical cancer screening. The HPV test looks for the virus that can cause the cell changes on the cervix. The cells collected during the Pap test can be  tested for HPV. The HPV test could be used to screen women aged 30 years and older, and should be used in women of any age who have unclear Pap test results. After the age of 30, women should have HPV testing at the same frequency as a Pap test.  Colorectal cancer can be detected and often prevented. Most routine colorectal cancer screening begins at the age of 50 years and continues through age 75 years. However, your health care provider may recommend screening at an earlier age if you have risk factors for colon cancer. On a yearly basis, your health care provider may provide home test kits to check for hidden blood in the stool. Use of a small camera at the end of a tube, to directly examine the colon (sigmoidoscopy or colonoscopy), can detect the earliest forms of colorectal cancer. Talk to your health care provider about this at age 50, when routine screening begins. Direct  exam of the colon should be repeated every 5-10 years through age 75 years, unless early forms of pre-cancerous polyps or small growths are found.  People who are at an increased risk for hepatitis B should be screened for this virus. You are considered at high risk for hepatitis B if:  You were born in a country where hepatitis B occurs often. Talk with your health care provider about which countries are considered high risk.  Your parents were born in a high-risk country and you have not received a shot to protect against hepatitis B (hepatitis B vaccine).  You have HIV or AIDS.  You use needles to inject street drugs.  You live with, or have sex with, someone who has hepatitis B.  You get hemodialysis treatment.  You take certain medicines for conditions like cancer, organ transplantation, and autoimmune conditions.  Hepatitis C blood testing is recommended for all people born from 1945 through 1965 and any individual with known risks for hepatitis C.  Practice safe sex. Use condoms and avoid high-risk sexual practices to reduce the spread of sexually transmitted infections (STIs). STIs include gonorrhea, chlamydia, syphilis, trichomonas, herpes, HPV, and human immunodeficiency virus (HIV). Herpes, HIV, and HPV are viral illnesses that have no cure. They can result in disability, cancer, and death.  You should be screened for sexually transmitted illnesses (STIs) including gonorrhea and chlamydia if:  You are sexually active and are younger than 24 years.  You are older than 24 years and your health care provider tells you that you are at risk for this type of infection.  Your sexual activity has changed since you were last screened and you are at an increased risk for chlamydia or gonorrhea. Ask your health care provider if you are at risk.  If you are at risk of being infected with HIV, it is recommended that you take a prescription medicine daily to prevent HIV infection. This is  called preexposure prophylaxis (PrEP). You are considered at risk if:  You are a heterosexual woman, are sexually active, and are at increased risk for HIV infection.  You take drugs by injection.  You are sexually active with a partner who has HIV.  Talk with your health care provider about whether you are at high risk of being infected with HIV. If you choose to begin PrEP, you should first be tested for HIV. You should then be tested every 3 months for as long as you are taking PrEP.  Osteoporosis is a disease in which the bones lose minerals and strength   with aging. This can result in serious bone fractures or breaks. The risk of osteoporosis can be identified using a bone density scan. Women ages 65 years and over and women at risk for fractures or osteoporosis should discuss screening with their health care providers. Ask your health care provider whether you should take a calcium supplement or vitamin D to reduce the rate of osteoporosis.  Menopause can be associated with physical symptoms and risks. Hormone replacement therapy is available to decrease symptoms and risks. You should talk to your health care provider about whether hormone replacement therapy is right for you.  Use sunscreen. Apply sunscreen liberally and repeatedly throughout the day. You should seek shade when your shadow is shorter than you. Protect yourself by wearing long sleeves, pants, a wide-brimmed hat, and sunglasses year round, whenever you are outdoors.  Once a month, do a whole body skin exam, using a mirror to look at the skin on your back. Tell your health care provider of new moles, moles that have irregular borders, moles that are larger than a pencil eraser, or moles that have changed in shape or color.  Stay current with required vaccines (immunizations).  Influenza vaccine. All adults should be immunized every year.  Tetanus, diphtheria, and acellular pertussis (Td, Tdap) vaccine. Pregnant women should  receive 1 dose of Tdap vaccine during each pregnancy. The dose should be obtained regardless of the length of time since the last dose. Immunization is preferred during the 27th-36th week of gestation. An adult who has not previously received Tdap or who does not know her vaccine status should receive 1 dose of Tdap. This initial dose should be followed by tetanus and diphtheria toxoids (Td) booster doses every 10 years. Adults with an unknown or incomplete history of completing a 3-dose immunization series with Td-containing vaccines should begin or complete a primary immunization series including a Tdap dose. Adults should receive a Td booster every 10 years.  Varicella vaccine. An adult without evidence of immunity to varicella should receive 2 doses or a second dose if she has previously received 1 dose. Pregnant females who do not have evidence of immunity should receive the first dose after pregnancy. This first dose should be obtained before leaving the health care facility. The second dose should be obtained 4-8 weeks after the first dose.  Human papillomavirus (HPV) vaccine. Females aged 13-26 years who have not received the vaccine previously should obtain the 3-dose series. The vaccine is not recommended for use in pregnant females. However, pregnancy testing is not needed before receiving a dose. If a female is found to be pregnant after receiving a dose, no treatment is needed. In that case, the remaining doses should be delayed until after the pregnancy. Immunization is recommended for any person with an immunocompromised condition through the age of 26 years if she did not get any or all doses earlier. During the 3-dose series, the second dose should be obtained 4-8 weeks after the first dose. The third dose should be obtained 24 weeks after the first dose and 16 weeks after the second dose.  Zoster vaccine. One dose is recommended for adults aged 60 years or older unless certain conditions are  present.  Measles, mumps, and rubella (MMR) vaccine. Adults born before 1957 generally are considered immune to measles and mumps. Adults born in 1957 or later should have 1 or more doses of MMR vaccine unless there is a contraindication to the vaccine or there is laboratory evidence of immunity to   each of the three diseases. A routine second dose of MMR vaccine should be obtained at least 28 days after the first dose for students attending postsecondary schools, health care workers, or international travelers. People who received inactivated measles vaccine or an unknown type of measles vaccine during 1963-1967 should receive 2 doses of MMR vaccine. People who received inactivated mumps vaccine or an unknown type of mumps vaccine before 1979 and are at high risk for mumps infection should consider immunization with 2 doses of MMR vaccine. For females of childbearing age, rubella immunity should be determined. If there is no evidence of immunity, females who are not pregnant should be vaccinated. If there is no evidence of immunity, females who are pregnant should delay immunization until after pregnancy. Unvaccinated health care workers born before 1957 who lack laboratory evidence of measles, mumps, or rubella immunity or laboratory confirmation of disease should consider measles and mumps immunization with 2 doses of MMR vaccine or rubella immunization with 1 dose of MMR vaccine.  Pneumococcal 13-valent conjugate (PCV13) vaccine. When indicated, a person who is uncertain of her immunization history and has no record of immunization should receive the PCV13 vaccine. An adult aged 19 years or older who has certain medical conditions and has not been previously immunized should receive 1 dose of PCV13 vaccine. This PCV13 should be followed with a dose of pneumococcal polysaccharide (PPSV23) vaccine. The PPSV23 vaccine dose should be obtained at least 8 weeks after the dose of PCV13 vaccine. An adult aged 19  years or older who has certain medical conditions and previously received 1 or more doses of PPSV23 vaccine should receive 1 dose of PCV13. The PCV13 vaccine dose should be obtained 1 or more years after the last PPSV23 vaccine dose.  Pneumococcal polysaccharide (PPSV23) vaccine. When PCV13 is also indicated, PCV13 should be obtained first. All adults aged 65 years and older should be immunized. An adult younger than age 65 years who has certain medical conditions should be immunized. Any person who resides in a nursing home or long-term care facility should be immunized. An adult smoker should be immunized. People with an immunocompromised condition and certain other conditions should receive both PCV13 and PPSV23 vaccines. People with human immunodeficiency virus (HIV) infection should be immunized as soon as possible after diagnosis. Immunization during chemotherapy or radiation therapy should be avoided. Routine use of PPSV23 vaccine is not recommended for American Indians, [place] Natives, or people younger than 65 years unless there are medical conditions that require PPSV23 vaccine. When indicated, people who have unknown immunization and have no record of immunization should receive PPSV23 vaccine. One-time revaccination 5 years after the first dose of PPSV23 is recommended for people aged 19-64 years who have chronic kidney failure, nephrotic syndrome, asplenia, or immunocompromised conditions. People who received 1-2 doses of PPSV23 before age 65 years should receive another dose of PPSV23 vaccine at age 65 years or later if at least 5 years have passed since the previous dose. Doses of PPSV23 are not needed for people immunized with PPSV23 at or after age 65 years.  Meningococcal vaccine. Adults with asplenia or persistent complement component deficiencies should receive 2 doses of quadrivalent meningococcal conjugate (MenACWY-D) vaccine. The doses should be obtained at least 2 months apart.  Microbiologists working with certain meningococcal bacteria, military recruits, people at risk during an outbreak, and people who travel to or live in countries with a high rate of meningitis should be immunized. A first-year college student up through age   21 years who is living in a residence hall should receive a dose if she did not receive a dose on or after her 16th birthday. Adults who have certain high-risk conditions should receive one or more doses of vaccine.  Hepatitis A vaccine. Adults who wish to be protected from this disease, have certain high-risk conditions, work with hepatitis A-infected animals, work in hepatitis A research labs, or travel to or work in countries with a high rate of hepatitis A should be immunized. Adults who were previously unvaccinated and who anticipate close contact with an international adoptee during the first 60 days after arrival in the [place] from a country with a high rate of hepatitis A should be immunized.  Hepatitis B vaccine. Adults who wish to be protected from this disease, have certain high-risk conditions, may be exposed to blood or other infectious body fluids, are household contacts or sex partners of hepatitis B positive people, are clients or workers in certain care facilities, or travel to or work in countries with a high rate of hepatitis B should be immunized.  Haemophilus influenzae type b (Hib) vaccine. A previously unvaccinated person with asplenia or sickle cell disease or having a scheduled splenectomy should receive 1 dose of Hib vaccine. Regardless of previous immunization, a recipient of a hematopoietic stem cell transplant should receive a 3-dose series 6-12 months after her successful transplant. Hib vaccine is not recommended for adults with HIV infection. Preventive Services / Frequency  Ages 19 to 39 years 1. Blood pressure check. 2. Lipid and cholesterol check. 3. Clinical breast exam.** / Every 3 years for women in their  20s and 30s. 4. BRCA-related cancer risk assessment.** / For women who have family members with a BRCA-related cancer (breast, ovarian, tubal, or peritoneal cancers). 5. Pap test.** / Every 2 years from ages 21 through 29. Every 3 years starting at age 30 through age 65 or 70 with a history of 3 consecutive normal Pap tests. 6. HPV screening.** / Every 3 years from ages 30 through ages 65 to 70 with a history of 3 consecutive normal Pap tests. 7. Hepatitis C blood test.** / For any individual with known risks for hepatitis C. 8. Skin self-exam. / Monthly. 9. Influenza vaccine. / Every year. 10. Tetanus, diphtheria, and acellular pertussis (Tdap, Td) vaccine.** / Consult your health care provider. Pregnant women should receive 1 dose of Tdap vaccine during each pregnancy. 1 dose of Td every 10 years. 11. Varicella vaccine.** / Consult your health care provider. Pregnant females who do not have evidence of immunity should receive the first dose after pregnancy. 12. HPV vaccine. / 3 doses over 6 months, if 26 and younger. The vaccine is not recommended for use in pregnant females. However, pregnancy testing is not needed before receiving a dose. 13. Measles, mumps, rubella (MMR) vaccine.** / You need at least 1 dose of MMR if you were born in 1957 or later. You may also need a 2nd dose. For females of childbearing age, rubella immunity should be determined. If there is no evidence of immunity, females who are not pregnant should be vaccinated. If there is no evidence of immunity, females who are pregnant should delay immunization until after pregnancy. 14. Pneumococcal 13-valent conjugate (PCV13) vaccine.** / Consult your health care provider. 15. Pneumococcal polysaccharide (PPSV23) vaccine.** / 1 to 2 doses if you smoke cigarettes or if you have certain conditions. 16. Meningococcal vaccine.** / 1 dose if you are age 19 to 21 years and a   first-year college student living in a residence hall, or have one  of several medical conditions, you need to get vaccinated against meningococcal disease. You may also need additional booster doses. 17. Hepatitis A vaccine.** / Consult your health care provider. 18. Hepatitis B vaccine.** / Consult your health care provider. 19. Haemophilus influenzae type b (Hib) vaccine.** / Consult your health care provider.  24. Chlamydia, HIV, and other sexually transmitted diseases- Get screened every year until age 54, then within three months of each new sexual provider. 21. Pap Smear- Every 1-3 years; discuss with your health care provider. 28. Mammogram- Every year starting at age 40  Take these steps 1. Do not smoke-Your healthcare provider can help you quit.  For tips on how to quit go to www.smokefree.gov or call 1-800 QUITNOW. 2. Be physically active- Exercise 5 days a week for at least 30 minutes.  If you are not already physically active, start slow and gradually work up to 30 minutes of moderate physical activity.  Examples of moderate activity include walking briskly, dancing, swimming, bicycling, etc. 3. Breast Cancer- A self breast exam every month is important for early detection of breast cancer.  For more information and instruction on self breast exams, ask your healthcare provider or https://www.patel.info/. 4. Eat a healthy diet- Eat a variety of healthy foods such as fruits, vegetables, whole grains, low fat milk, low fat cheeses, yogurt, lean meats, poultry and fish, beans, nuts, tofu, etc.  For more information go to www. Thenutritionsource.org 5. Drink alcohol in moderation- Limit alcohol intake to one drink or less per day. Never drink and drive. 6. Depression- Your emotional health is as important as your physical health.  If you're feeling down or losing interest in things you normally enjoy please talk to your healthcare provider about being screened for depression. 7. Dental visit- Brush and floss your teeth twice daily;  visit your dentist twice a year. 8. Eye doctor- Get an eye exam at least every 2 years. 9. Helmet use- Always wear a helmet when riding a bicycle, motorcycle, rollerblading or skateboarding. 6. Safe sex- If you may be exposed to sexually transmitted infections, use a condom. 11. Seat belts- Seat belts can save your live; always wear one. 12. Smoke/Carbon Monoxide detectors- These detectors need to be installed on the appropriate level of your home. Replace batteries at least once a year. 13. Skin cancer- When out in the sun please cover up and use sunscreen 15 SPF or higher. 14. Violence- If anyone is threatening or hurting you, please tell your healthcare provider.

## 2017-07-21 LAB — CBC WITH DIFFERENTIAL/PLATELET
BASOS PCT: 0.4 %
Basophils Absolute: 27 cells/uL (ref 0–200)
EOS ABS: 82 {cells}/uL (ref 15–500)
Eosinophils Relative: 1.2 %
HCT: 43 % (ref 35.0–45.0)
HEMOGLOBIN: 14.2 g/dL (ref 11.7–15.5)
Lymphs Abs: 2387 cells/uL (ref 850–3900)
MCH: 28.4 pg (ref 27.0–33.0)
MCHC: 33 g/dL (ref 32.0–36.0)
MCV: 86 fL (ref 80.0–100.0)
MONOS PCT: 6.8 %
MPV: 10.4 fL (ref 7.5–12.5)
NEUTROS ABS: 3842 {cells}/uL (ref 1500–7800)
Neutrophils Relative %: 56.5 %
PLATELETS: 263 10*3/uL (ref 140–400)
RBC: 5 10*6/uL (ref 3.80–5.10)
RDW: 13 % (ref 11.0–15.0)
TOTAL LYMPHOCYTE: 35.1 %
WBC mixed population: 462 cells/uL (ref 200–950)
WBC: 6.8 10*3/uL (ref 3.8–10.8)

## 2017-07-21 LAB — URINALYSIS, COMPLETE
BACTERIA UA: NONE SEEN /HPF
BILIRUBIN URINE: NEGATIVE
GLUCOSE, UA: NEGATIVE
Hgb urine dipstick: NEGATIVE
Ketones, ur: NEGATIVE
Nitrite: NEGATIVE
PROTEIN: NEGATIVE
RBC / HPF: NONE SEEN /HPF (ref 0–2)
SPECIFIC GRAVITY, URINE: 1.022 (ref 1.001–1.03)
pH: 5.5 (ref 5.0–8.0)

## 2017-07-21 LAB — HEPATIC FUNCTION PANEL
AG RATIO: 1.9 (calc) (ref 1.0–2.5)
ALBUMIN MSPROF: 5 g/dL (ref 3.6–5.1)
ALT: 18 U/L (ref 6–29)
AST: 18 U/L (ref 10–30)
Alkaline phosphatase (APISO): 55 U/L (ref 33–115)
BILIRUBIN INDIRECT: 0.4 mg/dL (ref 0.2–1.2)
Bilirubin, Direct: 0.1 mg/dL (ref 0.0–0.2)
GLOBULIN: 2.7 g/dL (ref 1.9–3.7)
TOTAL PROTEIN: 7.7 g/dL (ref 6.1–8.1)
Total Bilirubin: 0.5 mg/dL (ref 0.2–1.2)

## 2017-07-21 LAB — TSH: TSH: 1.38 m[IU]/L

## 2017-07-21 LAB — BASIC METABOLIC PANEL WITH GFR
BUN: 9 mg/dL (ref 7–25)
CALCIUM: 9.6 mg/dL (ref 8.6–10.2)
CHLORIDE: 106 mmol/L (ref 98–110)
CO2: 26 mmol/L (ref 20–32)
CREATININE: 0.92 mg/dL (ref 0.50–1.10)
GFR, Est African American: 100 mL/min/{1.73_m2} (ref >=60)
GFR, Est Non African American: 86 mL/min/{1.73_m2} (ref >=60)
GLUCOSE: 81 mg/dL (ref 65–99)
Potassium: 4.5 mmol/L (ref 3.5–5.3)
Sodium: 139 mmol/L (ref 135–146)

## 2017-07-21 LAB — MICROALBUMIN / CREATININE URINE RATIO
Creatinine, Urine: 225 mg/dL (ref 20–275)
Microalb Creat Ratio: 3 mcg/mg creat (ref <30)
Microalb, Ur: 0.6 mg/dL

## 2017-07-21 LAB — INSULIN, FASTING: INSULIN: 9.1 u[IU]/mL (ref 2.0–19.6)

## 2017-07-21 LAB — VITAMIN D 25 HYDROXY (VIT D DEFICIENCY, FRACTURES): Vit D, 25-Hydroxy: 28 ng/mL — ABNORMAL LOW (ref 30–100)

## 2017-07-21 NOTE — Progress Notes (Signed)
Pt aware of lab results & voiced understanding of those results.

## 2017-08-10 DIAGNOSIS — Z01419 Encounter for gynecological examination (general) (routine) without abnormal findings: Secondary | ICD-10-CM | POA: Diagnosis not present

## 2017-08-10 DIAGNOSIS — Z118 Encounter for screening for other infectious and parasitic diseases: Secondary | ICD-10-CM | POA: Diagnosis not present

## 2017-08-10 DIAGNOSIS — Z6821 Body mass index (BMI) 21.0-21.9, adult: Secondary | ICD-10-CM | POA: Diagnosis not present

## 2017-08-10 DIAGNOSIS — R87610 Atypical squamous cells of undetermined significance on cytologic smear of cervix (ASC-US): Secondary | ICD-10-CM | POA: Diagnosis not present

## 2017-08-10 DIAGNOSIS — Z114 Encounter for screening for human immunodeficiency virus [HIV]: Secondary | ICD-10-CM | POA: Diagnosis not present

## 2017-08-10 DIAGNOSIS — Z113 Encounter for screening for infections with a predominantly sexual mode of transmission: Secondary | ICD-10-CM | POA: Diagnosis not present

## 2017-08-10 DIAGNOSIS — Z1159 Encounter for screening for other viral diseases: Secondary | ICD-10-CM | POA: Diagnosis not present

## 2018-08-21 ENCOUNTER — Encounter: Payer: Self-pay | Admitting: Internal Medicine

## 2018-08-30 DIAGNOSIS — Z01419 Encounter for gynecological examination (general) (routine) without abnormal findings: Secondary | ICD-10-CM | POA: Diagnosis not present

## 2018-08-30 DIAGNOSIS — R87610 Atypical squamous cells of undetermined significance on cytologic smear of cervix (ASC-US): Secondary | ICD-10-CM | POA: Diagnosis not present

## 2018-08-30 DIAGNOSIS — Z6821 Body mass index (BMI) 21.0-21.9, adult: Secondary | ICD-10-CM | POA: Diagnosis not present

## 2019-11-11 DIAGNOSIS — Z20828 Contact with and (suspected) exposure to other viral communicable diseases: Secondary | ICD-10-CM | POA: Diagnosis not present

## 2020-06-20 DIAGNOSIS — Z20822 Contact with and (suspected) exposure to covid-19: Secondary | ICD-10-CM | POA: Diagnosis not present

## 2023-09-28 ENCOUNTER — Encounter: Payer: Self-pay | Admitting: *Deleted

## 2023-10-28 NOTE — Progress Notes (Unsigned)
   ANNUAL EXAM Patient name: Stacy Vaughan MRN [id number]  Date of birth: 09-25-1991 Chief Complaint:   No chief complaint on file.  History of Present Illness:   Stacy Vaughan is a 32 y.o. G10P2001 female being seen today for a routine annual exam.   Current concerns: ***  Current birth control: Tubal ligation  Reviewed records from Dr. Cherly Hensen.   No LMP recorded. (Menstrual status: Other).  Last Pap/Pap History: 2016: ASCUS 2019: normal pap   Health Maintenance Due  Topic Date Due   COVID-19 Vaccine (1) Never done   Cervical Cancer Screening (HPV/Pap Cotest)  Never done   INFLUENZA VACCINE  Never done    Review of Systems:   Pertinent items are noted in HPI Denies any headaches, blurred vision, fatigue, shortness of breath, chest pain, abdominal pain, abnormal vaginal discharge/itching/odor/irritation, problems with periods, bowel movements, urination, or intercourse unless otherwise stated above. *** Pertinent History Reviewed:  Reviewed past medical,surgical, social and family history.  Reviewed problem list, medications and allergies. Physical Assessment:  There were no vitals filed for this visit.There is no height or weight on file to calculate BMI.   Physical Examination:  General appearance - well appearing, and in no distress Mental status - alert, oriented to person, place, and time Psych:  She has a normal mood and affect Skin - warm and dry, normal color, no suspicious lesions noted Chest - effort normal Heart - normal rate  Breasts - breasts appear normal, no suspicious masses, no skin or nipple changes or axillary nodes Abdomen - soft, nontender, nondistended, no masses or organomegaly Pelvic -  VULVA: normal appearing vulva with no masses, tenderness or lesions  VAGINA: normal appearing vagina with normal color and discharge, no lesions  CERVIX: normal appearing cervix without discharge or lesions, no CMT UTERUS: uterus is felt to be normal  size, shape, consistency and nontender  ADNEXA: No adnexal masses or tenderness noted. Extremities:  No swelling or varicosities noted  Chaperone present for exam  No results found for this or any previous visit (from the past 24 hours).  Assessment & Plan:  Diagnoses and all orders for this visit:  Encounter for annual routine gynecological examination  - Cervical cancer screening: Discussed guidelines. Pap with HPV done - Gardasil: {Blank single:19197::"***","has not yet had. Will provide information","completed","has not yet had. Counseling provided and she declines","Has not yet had. Counseling provided and pt accepts"} - GC/CT: {Blank single:19197::"accepts","declines","not indicated"} - Birth Control:  BTL - Breast Health: Encouraged self breast awareness/SBE. Teaching provided.  - F/U 12 months and prn     No orders of the defined types were placed in this encounter.   Meds: No orders of the defined types were placed in this encounter.   Follow-up: No follow-ups on file.  Milas Hock, MD 10/28/2023 11:28 AM

## 2023-10-31 ENCOUNTER — Encounter: Payer: Self-pay | Admitting: Obstetrics and Gynecology

## 2023-10-31 ENCOUNTER — Other Ambulatory Visit (HOSPITAL_COMMUNITY)
Admission: RE | Admit: 2023-10-31 | Discharge: 2023-10-31 | Disposition: A | Discharge status: Home / Self Care | Payer: Medicaid Other | Source: Ambulatory Visit | Attending: Obstetrics and Gynecology | Admitting: Obstetrics and Gynecology

## 2023-10-31 ENCOUNTER — Ambulatory Visit: Payer: Medicaid Other | Admitting: Obstetrics and Gynecology

## 2023-10-31 VITALS — BP 132/87 | HR 80 | Ht 67.5 in | Wt 161.0 lb

## 2023-10-31 DIAGNOSIS — F419 Anxiety disorder, unspecified: Secondary | ICD-10-CM

## 2023-10-31 DIAGNOSIS — L732 Hidradenitis suppurativa: Secondary | ICD-10-CM | POA: Diagnosis not present

## 2023-10-31 DIAGNOSIS — Z01419 Encounter for gynecological examination (general) (routine) without abnormal findings: Secondary | ICD-10-CM | POA: Insufficient documentation

## 2023-10-31 MED ORDER — BUSPIRONE HCL 5 MG PO TABS: 5.0000 mg | ORAL_TABLET | Freq: Two times a day (BID) | ORAL | 12 refills | Status: DC

## 2023-10-31 MED ORDER — CLINDAMYCIN PHOSPHATE 1 % EX GEL: Freq: Two times a day (BID) | CUTANEOUS | 11 refills | Status: DC

## 2023-11-04 LAB — CYTOLOGY - PAP
Chlamydia: NEGATIVE
Comment: NEGATIVE
Comment: NEGATIVE
Comment: NEGATIVE
Comment: NEGATIVE
Comment: NEGATIVE
Comment: NORMAL
Diagnosis: UNDETERMINED — AB
HPV 16: NEGATIVE
HPV 18 / 45: POSITIVE — AB
High risk HPV: POSITIVE — AB
Neisseria Gonorrhea: NEGATIVE
Trichomonas: NEGATIVE

## 2023-11-08 ENCOUNTER — Encounter: Payer: Self-pay | Admitting: Obstetrics and Gynecology

## 2023-11-09 ENCOUNTER — Telehealth: Payer: Self-pay | Admitting: Licensed Clinical Social Worker

## 2023-11-09 NOTE — Telephone Encounter (Signed)
 [facility] contacted patient on this date to provide [facility] intro and to schedule appointment. Appt scheduled for 11/16/2023.

## 2023-11-16 ENCOUNTER — Ambulatory Visit: Payer: Medicaid Other | Admitting: Licensed Clinical Social Worker

## 2023-11-16 DIAGNOSIS — F4322 Adjustment disorder with anxiety: Secondary | ICD-10-CM | POA: Diagnosis not present

## 2023-11-16 NOTE — BH Specialist Note (Signed)
Integrated Behavioral Health via Telemedicine Visit  11/21/2023 Stacy Vaughan [id number]  Number of Integrated Behavioral Health Clinician visits: 1- Initial Visit  Session Start time: 1300   Session End time: 1342  Total time in minutes: 42   Referring Provider: Milas Hock Patient/Family location: At [facility] Provider location: Remote office All persons participating in visit: Patient and [facility] Types of Service: Individual psychotherapy and Video visit  I connected with Stacy Vaughan and/or Stacy Vaughan patient via  Telephone or Video Enabled Telemedicine Application  (Video is Caregility application) and verified that I am speaking with the correct person using two identifiers. Discussed confidentiality: Yes   I discussed the limitations of telemedicine and the availability of in person appointments.  Discussed there is a possibility of technology failure and discussed alternative modes of communication if that failure occurs.  I discussed that engaging in this telemedicine visit, they consent to the provision of behavioral healthcare and the services will be billed under their insurance.  Patient and/or legal guardian expressed understanding and consented to Telemedicine visit: Yes   Presenting Concerns: Patient and/or family reports the following symptoms/concerns: increased anxiety symptoms  Duration of problem: Months; Severity of problem: moderate  Patient and/or Family's Strengths/Protective Factors: Concrete supports in place (healthy food, safe environments, etc.), Physical Health (exercise, healthy diet, medication compliance, etc.), Caregiver has knowledge of parenting & child development, and Parental Resilience  Goals Addressed: Patient will:  Reduce symptoms of: anxiety   Increase knowledge and/or ability of: coping skills, healthy habits, and self-management skills   Demonstrate ability to: Increase healthy adjustment to current life  circumstances  Progress towards Goals: Achieved  Interventions: Interventions utilized:  Mindfulness or Management consultant, Supportive Counseling, Psychoeducation and/or Health Education, and Supportive Reflection Standardized Assessments completed: Not Needed  Patient and/or Family Response: The patient attended today's virtual session as scheduled. She worked to process increased anxiety related to her daughter's recent diagnosis, reporting ongoing worry about her daughter's health, well-being, and fear of her getting sick due to the diagnosis. However, the patient shared that things have significantly improved, and her daughter is doing better. The patient also processed life transitions and adjustments, noting that things have gotten much better overall. The patient discussed being prescribed anxiety medication but expressed feelings of anxiety about taking the medication. She described physical symptoms of anxiety, including a sensation of her stomach being in knots and her heart racing. The patient acknowledged a history of childhood anxiety and reported that coping strategies such as deep breathing, smelling lavender, walking, and exercising have been helpful in managing her symptoms. She demonstrated a solid understanding of her anxiety symptoms and successfully explored coping strategies that she plans to use moving forward. The patient expressed confidence in her ability to utilize coping strategies to manage anxiety and declined scheduling a follow-up session at this time.   Assessment: Patient currently experiencing heightened anxiety related to her daughter's recent diagnosis, with ongoing concerns about her health and well-being. While she reports improvement in both her daughter's condition and her own emotional state, she continues to struggle with anxiety symptoms, including physical sensations such as stomach discomfort and racing heart, but is confident in her ability to manage  these symptoms using coping strategies..   Patient may benefit from continued support of integrated behavioral health when needed.  Plan: Follow up with behavioral health clinician on : No follow up scheduled.  Behavioral recommendations: Continue utilizing your effective coping strategies, such as deep breathing, walking, and using  lavender to manage anxiety symptoms. It is also recommended that you monitor her anxiety levels and consider further exploring the use of anxiety medication. Try using your grounding techniques and challenging your irrational thoughts.  Referral(s): Integrated [employer] (In Clinic)  I discussed the assessment and treatment plan with the patient and/or parent/guardian. They were provided an opportunity to ask questions and all were answered. They agreed with the plan and demonstrated an understanding of the instructions.   They were advised to call back or seek an in-person evaluation if the symptoms worsen or if the condition fails to improve as anticipated.  Stacy Vaughan Cruzita Lederer, LCSWA

## 2023-11-22 DIAGNOSIS — R87619 Unspecified abnormal cytological findings in specimens from cervix uteri: Secondary | ICD-10-CM | POA: Insufficient documentation

## 2023-11-22 NOTE — Progress Notes (Unsigned)
    GYNECOLOGY OFFICE COLPOSCOPY PROCEDURE NOTE  33 y.o. G2P2001 here for colposcopy for ASCUS with POSITIVE high risk HPV pap smear on 10/31/23. She notes she feels like she has something in her throat but wonders if now worried from reading about HPV. Recommended follow up for this with PCP.   Last Pap/Pap History: 2016: ASCUS 2019: normal pap  Pregnancy test:  negative Gardasil:  completed. Discussed role for HPV in cervical dysplasia, need for surveillance.  Informed consent and review of risks, benefit and alternatives performed. Written consent given.   Speculum inserted into patient's vagina assuring full view of cervix and vaginal walls. 3 swabs of vinegar solution applied to the cervix and vaginal walls and colposcope was used to observe both the cervix and vaginal walls.   Colposcopy adequate? Yes No mosaicism, no punctation, no abnormal vasculature, and acetowhite lesion(s) noted at 12 o'clock; corresponding biopsies obtained after spraying with hurricane spray.  ECC collected.  All specimens were labeled and sent to pathology.  Monsel's applied to biopsy sites for good hemostasis and speculum removed.  Pt tolerated well with minimal pain and bleeding.   Patient was given post procedure instructions.  Will follow up pathology and manage accordingly; patient will be contacted with results and recommendations.  Routine preventative health maintenance measures emphasized.    Milas Hock, MD, FACOG Obstetrician & Gynecologist, [facility] for [facility], [facility]

## 2023-11-24 ENCOUNTER — Encounter: Payer: Self-pay | Admitting: Obstetrics and Gynecology

## 2023-11-24 ENCOUNTER — Ambulatory Visit: Payer: Medicaid Other | Admitting: Obstetrics and Gynecology

## 2023-11-24 ENCOUNTER — Other Ambulatory Visit (HOSPITAL_COMMUNITY)
Admission: RE | Admit: 2023-11-24 | Discharge: 2023-11-24 | Disposition: A | Discharge status: Home / Self Care | Payer: Medicaid Other | Source: Ambulatory Visit | Attending: Obstetrics and Gynecology | Admitting: Obstetrics and Gynecology

## 2023-11-24 VITALS — BP 132/84 | HR 86 | Ht 67.5 in | Wt 161.0 lb

## 2023-11-24 DIAGNOSIS — R87619 Unspecified abnormal cytological findings in specimens from cervix uteri: Secondary | ICD-10-CM | POA: Diagnosis present

## 2023-11-24 DIAGNOSIS — Z01812 Encounter for preprocedural laboratory examination: Secondary | ICD-10-CM | POA: Diagnosis not present

## 2023-11-24 LAB — POCT URINE PREGNANCY: Preg Test, Ur: NEGATIVE

## 2023-11-25 LAB — SURGICAL PATHOLOGY

## 2023-11-28 ENCOUNTER — Encounter: Payer: Self-pay | Admitting: Obstetrics and Gynecology

## 2023-11-28 NOTE — Progress Notes (Unsigned)
     New patient visit   Patient: Stacy Vaughan   DOB: [birth year]   33 y.o. Female  MRN: [id number] Visit Date: 11/29/2023  Today's healthcare provider: Charlton Amor, DO   No chief complaint on file.   SUBJECTIVE   No chief complaint on file.  HPI   Pt presents to establish care. Has concerns of anxiety.   Review of Systems     No outpatient medications have been marked as taking for the 11/29/23 encounter (Appointment) with Charlton Amor, DO.    OBJECTIVE    LMP 11/19/2023   Physical Exam     ASSESSMENT & PLAN    Problem List Items Addressed This Visit   None   No follow-ups on file.      No orders of the defined types were placed in this encounter.   No orders of the defined types were placed in this encounter.    Charlton Amor, DO  [facility] Primary Care & Sports Medicine at [facility] [phone] (phone) [phone] (fax)  [facility]

## 2023-11-29 ENCOUNTER — Ambulatory Visit (INDEPENDENT_AMBULATORY_CARE_PROVIDER_SITE_OTHER): Payer: Medicaid Other | Admitting: Family Medicine

## 2023-11-29 ENCOUNTER — Telehealth: Payer: Self-pay | Admitting: *Deleted

## 2023-11-29 ENCOUNTER — Encounter: Payer: Self-pay | Admitting: Family Medicine

## 2023-11-29 VITALS — BP 150/88 | HR 69 | Ht 67.5 in | Wt 165.0 lb

## 2023-11-29 DIAGNOSIS — F411 Generalized anxiety disorder: Secondary | ICD-10-CM | POA: Diagnosis not present

## 2023-11-29 DIAGNOSIS — R030 Elevated blood-pressure reading, without diagnosis of hypertension: Secondary | ICD-10-CM | POA: Diagnosis not present

## 2023-11-29 MED ORDER — ESCITALOPRAM OXALATE 10 MG PO TABS: 10.0000 mg | ORAL_TABLET | Freq: Every day | ORAL | 3 refills | Status: DC

## 2023-11-29 NOTE — Assessment & Plan Note (Signed)
Likely related to anxiety. Will repeat and monitor.

## 2023-11-29 NOTE — Telephone Encounter (Signed)
Left patient a message to call and schedule LEEP per Dr. Para March.

## 2023-11-29 NOTE — Assessment & Plan Note (Addendum)
Discussed buspar and starting a anxiety stabilizer medication and have recommended lexapro. Discussed how to take medication.  - follow up in one month for efficacy - denies homicidal/suicidal ideation

## 2023-11-30 ENCOUNTER — Ambulatory Visit: Payer: Medicaid Other | Admitting: Obstetrics and Gynecology

## 2023-11-30 ENCOUNTER — Other Ambulatory Visit (HOSPITAL_COMMUNITY)
Admission: RE | Admit: 2023-11-30 | Discharge: 2023-11-30 | Disposition: A | Discharge status: Home / Self Care | Payer: Medicaid Other | Source: Ambulatory Visit | Attending: Obstetrics and Gynecology | Admitting: Obstetrics and Gynecology

## 2023-11-30 VITALS — BP 149/83 | HR 77 | Ht 67.5 in | Wt 162.0 lb

## 2023-11-30 DIAGNOSIS — R87619 Unspecified abnormal cytological findings in specimens from cervix uteri: Secondary | ICD-10-CM | POA: Diagnosis not present

## 2023-11-30 DIAGNOSIS — R87613 High grade squamous intraepithelial lesion on cytologic smear of cervix (HGSIL): Secondary | ICD-10-CM

## 2023-11-30 LAB — POCT URINE PREGNANCY: Preg Test, Ur: NEGATIVE

## 2023-11-30 NOTE — Addendum Note (Signed)
Addended by: Granville Lewis on: 11/30/2023 10:14 AM   Modules accepted: Orders

## 2023-11-30 NOTE — Progress Notes (Signed)
   GYNECOLOGY OFFICE PROCEDURE NOTE  Stacy Vaughan is a 33 y.o. G2P2001 here for LEEP. No GYN concerns. Pap smear and colposcopy history reviewed.    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  Role of HPV,cervical dysplasia and need for close followup was empasized. Informed written consent was obtained. All questions were answered. Time out performed. Urine pregnancy test was Negative.  ??Procedure: The patient was placed in lithotomy position and the bivalved coated speculum was placed in the patient's vagina. A grounding pad placed on the patient. Acetic acid was applied to the cervix and the colposcopy was repeated.   Local anesthesia was administered via an intracervical block using 10 ml of 2% Lidocaine with epinephrine. The suction was turned on and the Large 1X Fisher Cone Biopsy Excisor on 50 Watts of blended current was used to excise the superior portion of the transformation zone. Did not excise inferior transformation zone (3-9 o'clock) as no lesions were visualized by myself or with her prior colposcopy. Post LEEP ECC was obtained. Excellent hemostasis was achieved using roller ball coagulation set at 50 Watts coagulation current. Monsel's solution was then applied and the speculum was removed from the vagina. Specimens were sent to pathology.  ?The patient tolerated the procedure well. Post-operative instructions given to patient, including instruction to seek medical attention for persistent bright red bleeding, fever, abdominal/pelvic pain, dysuria, nausea or vomiting. She was also told about the possibility of having copious yellow to black tinged discharge for weeks. She was counseled to  avoid anything in the vagina (sex/douching/tampons) for 4 weeks. She has a 4 week post-operative check to assess wound healing, review results and discuss further management.   Harvie Bridge, MD Obstetrician & Gynecologist, [facility] for [employer], [facility]

## 2023-12-05 LAB — SURGICAL PATHOLOGY

## 2023-12-06 ENCOUNTER — Telehealth: Payer: Self-pay | Admitting: *Deleted

## 2023-12-06 NOTE — Telephone Encounter (Signed)
Returned call from 10:26 AM. Left patient a message to call the office back.

## 2023-12-14 ENCOUNTER — Encounter: Payer: Self-pay | Admitting: Family Medicine

## 2023-12-14 ENCOUNTER — Ambulatory Visit (INDEPENDENT_AMBULATORY_CARE_PROVIDER_SITE_OTHER): Payer: Medicaid Other | Admitting: Family Medicine

## 2023-12-14 VITALS — BP 133/77 | HR 74 | Ht 67.5 in | Wt 164.2 lb

## 2023-12-14 DIAGNOSIS — R198 Other specified symptoms and signs involving the digestive system and abdomen: Secondary | ICD-10-CM | POA: Insufficient documentation

## 2023-12-14 DIAGNOSIS — Z0000 Encounter for general adult medical examination without abnormal findings: Secondary | ICD-10-CM

## 2023-12-14 NOTE — Assessment & Plan Note (Addendum)
Pt doing well, due for blood work - has had an increase in anxiety due to daughter having surgery tomorrow for brain tumor. She just completed chemo

## 2023-12-14 NOTE — Progress Notes (Signed)
Established patient visit   Patient: Stacy Vaughan   DOB: 1991/08/28   33 y.o. Female  MRN: [id number] Visit Date: 12/14/2023  Today's healthcare provider: Charlton Amor, DO   Chief Complaint  Patient presents with   Annual Exam    SUBJECTIVE    Chief Complaint  Patient presents with   Annual Exam   HPI  Pt presents for wellness visit today.   Screenings - pap done 11/2023 with CIN3 LEEP with recommended follow up in 6 months  Review of Systems  Constitutional:  Negative for activity change, fatigue and fever.  Respiratory:  Negative for cough and shortness of breath.   Cardiovascular:  Negative for chest pain.  Gastrointestinal:  Negative for abdominal pain.  Genitourinary:  Negative for difficulty urinating.       Current Meds  Medication Sig   busPIRone (BUSPAR) 5 MG tablet Take 1 tablet (5 mg total) by mouth 2 (two) times daily.    OBJECTIVE    BP 133/77 (BP Location: Left Arm, Patient Position: Sitting, Cuff Size: Large)   Pulse 74   Ht 5' 7.5" (1.715 m)   Wt 164 lb 4 oz (74.5 kg)   LMP 11/19/2023   SpO2 100%   BMI 25.35 kg/m   Physical Exam Vitals and nursing note reviewed.  Constitutional:      General: She is not in acute distress.    Appearance: Normal appearance.  HENT:     Head: Normocephalic and atraumatic.     Right Ear: External ear normal.     Left Ear: External ear normal.     Nose: Nose normal.  Eyes:     Conjunctiva/sclera: Conjunctivae normal.  Cardiovascular:     Rate and Rhythm: Normal rate and regular rhythm.  Pulmonary:     Effort: Pulmonary effort is normal.     Breath sounds: Normal breath sounds.  Abdominal:     General: Abdomen is flat. Bowel sounds are normal.     Palpations: Abdomen is soft.     Tenderness: There is no abdominal tenderness.  Neurological:     General: No focal deficit present.     Mental Status: She is alert and oriented to person, place, and time.  Psychiatric:        Mood and Affect:  Mood normal.        Behavior: Behavior normal.        Thought Content: Thought content normal.        Judgment: Judgment normal.        ASSESSMENT & PLAN    Problem List Items Addressed This Visit       Other   Change in bowel movement   Pt has concern today for colon cancer. Has noticed changes in bowel movement frequency. Discussed that it could be due to changes in appetite and nerves with everything her daughter is going through. However due to patient being so nervous have recommended she see GI and have a more targeted discussion with them.       Relevant Orders   Ambulatory referral to Gastroenterology   Routine adult health maintenance - Primary   Pt doing well, due for blood work - has had an increase in anxiety due to daughter having surgery tomorrow for brain tumor. She just completed chemo      Relevant Orders   Lipid panel   CMP14+EGFR   CBC    No follow-ups on file.      No  orders of the defined types were placed in this encounter.   Orders Placed This Encounter  Procedures   Lipid panel    Has the patient fasted?:   No    Release to patient:   Immediate   CMP14+EGFR    Has the patient fasted?:   No   CBC   Ambulatory referral to Gastroenterology    Referral Priority:   Routine    Referral Type:   Consultation    Referral Reason:   Specialty Services Required    Number of Visits Requested:   1     Charlton Amor, DO  [facility] Medicine at [facility] [phone] (phone) [phone] (fax)  [facility]

## 2023-12-14 NOTE — Assessment & Plan Note (Addendum)
Pt has concern today for colon cancer. Has noticed changes in bowel movement frequency. Discussed that it could be due to changes in appetite and nerves with everything her daughter is going through. However due to patient being so nervous have recommended she see GI and have a more targeted discussion with them.

## 2023-12-15 ENCOUNTER — Encounter: Payer: Medicaid Other | Admitting: Family Medicine

## 2023-12-15 LAB — CMP14+EGFR
ALT: 7 [IU]/L (ref 0–32)
AST: 11 [IU]/L (ref 0–40)
Albumin: 4.2 g/dL (ref 3.9–4.9)
Alkaline Phosphatase: 56 [IU]/L (ref 44–121)
BUN/Creatinine Ratio: 8 — ABNORMAL LOW (ref 9–23)
BUN: 7 mg/dL (ref 6–20)
Bilirubin Total: 0.5 mg/dL (ref 0.0–1.2)
CO2: 20 mmol/L (ref 20–29)
Calcium: 8.9 mg/dL (ref 8.7–10.2)
Chloride: 105 mmol/L (ref 96–106)
Creatinine, Ser: 0.91 mg/dL (ref 0.57–1.00)
Globulin, Total: 2.4 g/dL (ref 1.5–4.5)
Glucose: 82 mg/dL (ref 70–99)
Potassium: 4.1 mmol/L (ref 3.5–5.2)
Sodium: 140 mmol/L (ref 134–144)
Total Protein: 6.6 g/dL (ref 6.0–8.5)
eGFR: 86 mL/min/{1.73_m2} (ref >59)

## 2023-12-15 LAB — CBC
Hematocrit: 36.9 % (ref 34.0–46.6)
Hemoglobin: 12.3 g/dL (ref 11.1–15.9)
MCH: 29.9 pg (ref 26.6–33.0)
MCHC: 33.3 g/dL (ref 31.5–35.7)
MCV: 90 fL (ref 79–97)
Platelets: 249 10*3/uL (ref 150–450)
RBC: 4.12 x10E6/uL (ref 3.77–5.28)
RDW: 12 % (ref 11.7–15.4)
WBC: 5.6 10*3/uL (ref 3.4–10.8)

## 2023-12-15 LAB — LIPID PANEL
Chol/HDL Ratio: 3.4 {ratio} (ref 0.0–4.4)
Cholesterol, Total: 175 mg/dL (ref 100–199)
HDL: 51 mg/dL (ref >39)
LDL Chol Calc (NIH): 112 mg/dL — ABNORMAL HIGH (ref 0–99)
Triglycerides: 63 mg/dL (ref 0–149)
VLDL Cholesterol Cal: 12 mg/dL (ref 5–40)

## 2023-12-16 ENCOUNTER — Encounter: Payer: Self-pay | Admitting: Family Medicine

## 2023-12-19 ENCOUNTER — Encounter: Payer: Medicaid Other | Admitting: Family Medicine

## 2023-12-28 ENCOUNTER — Encounter: Payer: Self-pay | Admitting: Obstetrics and Gynecology

## 2023-12-28 ENCOUNTER — Ambulatory Visit: Payer: Medicaid Other | Admitting: Obstetrics and Gynecology

## 2023-12-28 VITALS — BP 135/83 | HR 72 | Ht 67.5 in | Wt 167.0 lb

## 2023-12-28 DIAGNOSIS — N879 Dysplasia of cervix uteri, unspecified: Secondary | ICD-10-CM | POA: Diagnosis not present

## 2023-12-28 NOTE — Progress Notes (Signed)
   RETURN GYNECOLOGY VISIT  Subjective:  Stacy Vaughan is a 33 y.o. 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  Is pretty sure she had the HPV vaccine but isn't able to confirm w/ family/pediatrician  Objective:   Vitals:   12/28/23 0837  BP: 135/83  Pulse: 72  Weight: 167 lb (75.8 kg)  Height: 5' 7.5" (1.715 m)    General:  Alert, oriented and cooperative. Patient is in no acute distress.  Skin: Skin is warm and dry. No rash noted.   Cardiovascular: Normal heart rate noted  Respiratory: Normal respiratory effort, no problems with respiration noted  Abdomen: Soft, non-tender, non-distended   Pelvic: NEFG. Cervix visually normal. Well healed.  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  No future appointments.  Lennart Pall, MD

## 2023-12-28 NOTE — Progress Notes (Signed)
 Pt has no concerns since LEEP

## 2024-03-08 ENCOUNTER — Encounter: Payer: Self-pay | Admitting: Family Medicine

## 2024-09-25 ENCOUNTER — Telehealth: Payer: Self-pay | Admitting: *Deleted

## 2024-09-25 NOTE — Telephone Encounter (Signed)
Left patient a message to call and schedule annual. 

## 2024-10-31 ENCOUNTER — Encounter: Payer: Medicaid Other | Admitting: Family Medicine

## 2024-11-27 NOTE — Progress Notes (Unsigned)
" ° °  ANNUAL EXAM Patient name: Stacy Vaughan MRN [id number]  Date of birth: [birth year] Chief Complaint:   No chief complaint on file.  History of Present Illness:   Stacy Vaughan is a 34 y.o. G10P2002 female being seen today for a routine annual exam.   Current concerns: ***  Current birth control: Tubal ligation  No LMP recorded. (Menstrual status: Bilateral Tubal Ligation).  Gardasil: Thinks she had it but unable to confirm.  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 *** Pertinent History Reviewed:  Reviewed past medical,surgical, social and family history.  Reviewed problem list, medications and allergies. Physical Assessment:  There were no vitals filed for this visit.There is no height or weight on file to calculate BMI.   Physical Examination:  General appearance - well appearing, and in no distress Mental status - alert, oriented to person, place, and time Psych:  She has a normal mood and affect Skin - warm and dry, normal color, no suspicious lesions noted Chest - effort normal Heart - normal rate  Breasts - breasts appear normal, no suspicious masses, no skin or nipple changes or axillary nodes Abdomen - soft, nontender, nondistended, no masses or organomegaly Pelvic -  {Blank single:19197::Performed and:,declines,not indicated} VULVA: {Blank single:19197::Not examined,normal appearing vulva with no masses, tenderness or lesions,***} VAGINA: {Blank  single:19197::Not examined,normal appearing vagina with normal color and discharge, no lesions,***} CERVIX: {Blank single:19197::Not examined,normal appearing cervix without discharge or lesions, no CMT.,***} UTERUS: {Blank single:19197::Not examined,uterus is felt to be normal size, shape, consistency and nontender,***} ADNEXA: {Blank single:19197::Not examined,No adnexal masses or tenderness noted,***} Extremities:  No swelling or varicosities noted  Chaperone present for exam  No results found for this or any previous visit (from the past 24 hours).  Assessment & Plan:  Diagnoses and all orders for this visit:  Encounter for annual routine gynecological examination  Abnormal cervical Papanicolaou smear, unspecified abnormal pap finding   - Cervical cancer screening: Discussed guidelines. Pap with HPV done - Gardasil: completed - GC/CT: {Blank single:19197::accepts,declines,not indicated} - HIV/RPR/HCV testing: {Blank single:19197::accepts,declines,not indicated} - Birth Control: Tubal ligation - Breast Health: Encouraged self breast awareness/SBE. Teaching provided.  - F/U 12 months and prn     No orders of the defined types were placed in this encounter.   Meds: No orders of the defined types were placed in this encounter.   Follow-up: No follow-ups on file.  Vina Solian, MD 11/27/2024 3:22 PM   "

## 2024-11-28 ENCOUNTER — Ambulatory Visit: Admitting: Obstetrics and Gynecology

## 2024-11-28 ENCOUNTER — Other Ambulatory Visit (HOSPITAL_COMMUNITY)
Admission: RE | Admit: 2024-11-28 | Discharge: 2024-11-28 | Disposition: A | Source: Ambulatory Visit | Attending: Obstetrics and Gynecology | Admitting: Obstetrics and Gynecology

## 2024-11-28 ENCOUNTER — Encounter: Payer: Self-pay | Admitting: Obstetrics and Gynecology

## 2024-11-28 VITALS — BP 113/75 | HR 71 | Ht 67.5 in | Wt 188.0 lb

## 2024-11-28 DIAGNOSIS — Z01419 Encounter for gynecological examination (general) (routine) without abnormal findings: Secondary | ICD-10-CM

## 2024-11-28 DIAGNOSIS — R87619 Unspecified abnormal cytological findings in specimens from cervix uteri: Secondary | ICD-10-CM

## 2024-12-03 LAB — CYTOLOGY - PAP
Adequacy: ABSENT
Comment: NEGATIVE
Diagnosis: NEGATIVE
Diagnosis: REACTIVE
High risk HPV: NEGATIVE

## 2024-12-04 ENCOUNTER — Ambulatory Visit: Payer: Self-pay | Admitting: Obstetrics and Gynecology
